# Patient Record
Sex: Female | Born: 1980 | Race: Black or African American | Hispanic: No | Marital: Single
Health system: Southern US, Community
[De-identification: ages and names within clinical notes are randomized; demographics above are authoritative.]

## PROBLEM LIST (undated history)

## (undated) DIAGNOSIS — J309 Allergic rhinitis, unspecified: Secondary | ICD-10-CM

## (undated) DIAGNOSIS — Z8571 Personal history of Hodgkin lymphoma: Secondary | ICD-10-CM

## (undated) DIAGNOSIS — K589 Irritable bowel syndrome without diarrhea: Secondary | ICD-10-CM

## (undated) DIAGNOSIS — G479 Sleep disorder, unspecified: Secondary | ICD-10-CM

## (undated) DIAGNOSIS — G43909 Migraine, unspecified, not intractable, without status migrainosus: Secondary | ICD-10-CM

## (undated) DIAGNOSIS — F32A Depression, unspecified: Secondary | ICD-10-CM

## (undated) DIAGNOSIS — F329 Major depressive disorder, single episode, unspecified: Secondary | ICD-10-CM

## (undated) HISTORY — DX: Personal history of Hodgkin lymphoma: Z85.71

## (undated) HISTORY — DX: Irritable bowel syndrome, unspecified: K58.9

## (undated) HISTORY — DX: Sleep disorder, unspecified: G47.9

## (undated) HISTORY — DX: Allergic rhinitis, unspecified: J30.9

## (undated) HISTORY — DX: Depression, unspecified: F32.A

## (undated) HISTORY — PX: CHOLECYSTECTOMY: SHX55

## (undated) HISTORY — DX: Major depressive disorder, single episode, unspecified: F32.9

## (undated) HISTORY — PX: LYMPH NODE BIOPSY: SHX201

## (undated) HISTORY — DX: Migraine, unspecified, not intractable, without status migrainosus: G43.909

---

## 2000-01-03 ENCOUNTER — Emergency Department (HOSPITAL_COMMUNITY): Admission: EM | Admit: 2000-01-03 | Discharge: 2000-01-04 | Payer: Self-pay | Admitting: Emergency Medicine

## 2002-02-11 ENCOUNTER — Other Ambulatory Visit: Admission: RE | Admit: 2002-02-11 | Discharge: 2002-02-11 | Payer: Self-pay | Admitting: Oncology

## 2002-02-11 ENCOUNTER — Encounter (INDEPENDENT_AMBULATORY_CARE_PROVIDER_SITE_OTHER): Payer: Self-pay | Admitting: Specialist

## 2002-08-31 DIAGNOSIS — Z8571 Personal history of Hodgkin lymphoma: Secondary | ICD-10-CM

## 2002-08-31 HISTORY — DX: Personal history of Hodgkin lymphoma: Z85.71

## 2003-08-07 ENCOUNTER — Other Ambulatory Visit: Admission: RE | Admit: 2003-08-07 | Discharge: 2003-08-07 | Payer: Self-pay | Admitting: Family Medicine

## 2003-12-01 ENCOUNTER — Encounter: Admission: RE | Admit: 2003-12-01 | Discharge: 2003-12-01 | Payer: Self-pay | Admitting: Family Medicine

## 2004-01-21 ENCOUNTER — Emergency Department (HOSPITAL_COMMUNITY): Admission: EM | Admit: 2004-01-21 | Discharge: 2004-01-21 | Payer: Self-pay | Admitting: Emergency Medicine

## 2004-01-28 ENCOUNTER — Encounter (INDEPENDENT_AMBULATORY_CARE_PROVIDER_SITE_OTHER): Payer: Self-pay | Admitting: *Deleted

## 2004-01-28 ENCOUNTER — Ambulatory Visit (HOSPITAL_COMMUNITY): Admission: RE | Admit: 2004-01-28 | Discharge: 2004-01-29 | Payer: Self-pay | Admitting: Surgery

## 2004-03-23 ENCOUNTER — Ambulatory Visit: Payer: Self-pay | Admitting: Oncology

## 2004-10-22 ENCOUNTER — Other Ambulatory Visit: Admission: RE | Admit: 2004-10-22 | Discharge: 2004-10-22 | Payer: Self-pay | Admitting: *Deleted

## 2005-11-07 ENCOUNTER — Encounter: Admission: RE | Admit: 2005-11-07 | Discharge: 2005-11-07 | Payer: Self-pay | Admitting: Gastroenterology

## 2007-01-25 ENCOUNTER — Other Ambulatory Visit: Admission: RE | Admit: 2007-01-25 | Discharge: 2007-01-25 | Payer: Self-pay | Admitting: Family Medicine

## 2008-03-14 ENCOUNTER — Other Ambulatory Visit: Admission: RE | Admit: 2008-03-14 | Discharge: 2008-03-14 | Payer: Self-pay | Admitting: Family Medicine

## 2009-04-09 ENCOUNTER — Other Ambulatory Visit: Admission: RE | Admit: 2009-04-09 | Discharge: 2009-04-09 | Payer: Self-pay | Admitting: Family Medicine

## 2010-05-07 ENCOUNTER — Other Ambulatory Visit
Admission: RE | Admit: 2010-05-07 | Discharge: 2010-05-07 | Payer: Self-pay | Source: Home / Self Care | Admitting: Family Medicine

## 2010-09-17 NOTE — Op Note (Signed)
Jessica Suarez, Jessica Suarez              ACCOUNT NO.:  [id number]   MEDICAL RECORD NO.:  [id number]          PATIENT TYPE:  OIB   LOCATION:  2550                         FACILITY:  [facility]   PHYSICIAN:  Thornton Park. Daphine Deutscher, M.D.DATE OF BIRTH:  1981/04/23   DATE OF PROCEDURE:  01/28/2004  DATE OF DISCHARGE:                                 OPERATIVE REPORT   PREOPERATIVE DIAGNOSIS:  History of Hodgkin's disease with recurrent bouts  of upper abdominal pain and gallstones.   POSTOPERATIVE DIAGNOSIS:  History of Hodgkin's disease with recurrent bouts  of upper abdominal pain and gallstones.   OPERATION PERFORMED:  Laparoscopic cholecystectomy with intraoperative  cholangiograms.   FINDINGS:  Marked sclerotic adhesions to the gallbladder possible secondary  to radiation as well as cholecystitis.  Normal small ductal anatomy noted.   SURGEON:  Thornton Park. Daphine Deutscher, M.D.   ASSISTANT:  Jimmye Norman, M.D.   ANESTHESIA:  General.   INDICATIONS FOR PROCEDURE:  Jessica Suarez is a 30 year old with a history of  successfully treated Hodgkin's disease.   DESCRIPTION OF PROCEDURE:  She was taken to room 17 on January 28, 2004  and given general anesthesia.  The abdomen was prepped with Betadine and  draped sterilely.  A longitudinal incision was made down into her umbilicus  through which I gently dilated to the point where the Hasson cannula could  fit.  The abdomen was insufflated and then three trocars were placed in the  upper abdomen, a 10 in the midline and two laterally.  The gallbladder was  grasped and it had some liver tissue growing out on this but we grasped and  elevated it.  It was long, had a lot of whitish thick adhesions along the  body and fundus.  The fundus was impacted with stones.  This was grasped and  elevated and I carefully dissected free what eventually was Calot's  triangle.  There was also a lymph node in Calot's triangle which I plucked  and sent separately for pathologic  examination.  Clip was placed up on the  gallbladder and the cystic duct was incised and a Reddick catheter was  inserted and a dynamic cholangiogram performed which showed a very small  common bile duct with intrahepatic filling and then free flow into the  duodenum.  The cystic duct was triple clipped, divided.  The cystic artery  was double clipped and divided and then the gallbladder was removed from the  gallbladder bed without entering it. Hemostasis was achieved throughout.  Once detached it was put in a back and brought out through the umbilicus  where it was very tedious to crush the stones and get them out and  eventually got the gallbladder and the gallstones out with the bag intact.  I then went in and repaired the umbilical defect with a figure-of-eight  suture of 0 Vicryl.  I went back in and looked at the  gallbladder bed and no bleeding or bile leaks were noted.  The wounds were  infiltrated with 0.5% Marcaine plain.  The patient then had 4-0 Vicryl  placed in the subcutaneous tissue with  benzoin and Steri-Strips.  The  patient tolerated the procedure well and taken to recovery room in  satisfactory condition.       MBM/MEDQ  D:  01/28/2004  T:  01/28/2004  Job:  [id number]   cc:   Dr. Lester [place]

## 2014-07-24 ENCOUNTER — Other Ambulatory Visit (HOSPITAL_COMMUNITY)
Admission: RE | Admit: 2014-07-24 | Discharge: 2014-07-24 | Disposition: A | Discharge status: Home / Self Care | Payer: Managed Care, Other (non HMO) | Source: Ambulatory Visit | Attending: Family Medicine | Admitting: Family Medicine

## 2014-07-24 ENCOUNTER — Other Ambulatory Visit: Payer: Self-pay | Admitting: Family Medicine

## 2014-07-24 DIAGNOSIS — Z1151 Encounter for screening for human papillomavirus (HPV): Secondary | ICD-10-CM | POA: Diagnosis present

## 2014-07-24 DIAGNOSIS — Z124 Encounter for screening for malignant neoplasm of cervix: Secondary | ICD-10-CM | POA: Insufficient documentation

## 2014-07-24 DIAGNOSIS — Z113 Encounter for screening for infections with a predominantly sexual mode of transmission: Secondary | ICD-10-CM | POA: Insufficient documentation

## 2014-07-29 LAB — CYTOLOGY - PAP

## 2015-01-10 DIAGNOSIS — R05 Cough: Secondary | ICD-10-CM | POA: Insufficient documentation

## 2015-01-10 DIAGNOSIS — R059 Cough, unspecified: Secondary | ICD-10-CM

## 2015-01-10 DIAGNOSIS — J309 Allergic rhinitis, unspecified: Secondary | ICD-10-CM

## 2015-01-10 DIAGNOSIS — H1010 Acute atopic conjunctivitis, unspecified eye: Secondary | ICD-10-CM | POA: Insufficient documentation

## 2015-01-30 ENCOUNTER — Ambulatory Visit (INDEPENDENT_AMBULATORY_CARE_PROVIDER_SITE_OTHER): Payer: Managed Care, Other (non HMO) | Admitting: Neurology

## 2015-01-30 DIAGNOSIS — J309 Allergic rhinitis, unspecified: Secondary | ICD-10-CM

## 2015-02-06 ENCOUNTER — Ambulatory Visit (INDEPENDENT_AMBULATORY_CARE_PROVIDER_SITE_OTHER): Payer: Managed Care, Other (non HMO) | Admitting: Neurology

## 2015-02-06 DIAGNOSIS — J309 Allergic rhinitis, unspecified: Secondary | ICD-10-CM | POA: Diagnosis not present

## 2015-02-20 ENCOUNTER — Ambulatory Visit (INDEPENDENT_AMBULATORY_CARE_PROVIDER_SITE_OTHER): Payer: Managed Care, Other (non HMO) | Admitting: Neurology

## 2015-02-20 DIAGNOSIS — J309 Allergic rhinitis, unspecified: Secondary | ICD-10-CM | POA: Diagnosis not present

## 2015-03-19 ENCOUNTER — Ambulatory Visit (INDEPENDENT_AMBULATORY_CARE_PROVIDER_SITE_OTHER): Payer: Managed Care, Other (non HMO)

## 2015-03-19 DIAGNOSIS — J309 Allergic rhinitis, unspecified: Secondary | ICD-10-CM | POA: Diagnosis not present

## 2015-03-25 ENCOUNTER — Ambulatory Visit (INDEPENDENT_AMBULATORY_CARE_PROVIDER_SITE_OTHER): Payer: Managed Care, Other (non HMO)

## 2015-03-25 DIAGNOSIS — J309 Allergic rhinitis, unspecified: Secondary | ICD-10-CM | POA: Diagnosis not present

## 2015-03-27 ENCOUNTER — Other Ambulatory Visit: Payer: Self-pay | Admitting: Allergy and Immunology

## 2015-03-30 NOTE — Telephone Encounter (Signed)
Patient needs office visit.  

## 2015-04-10 ENCOUNTER — Ambulatory Visit (INDEPENDENT_AMBULATORY_CARE_PROVIDER_SITE_OTHER): Payer: Managed Care, Other (non HMO) | Admitting: Neurology

## 2015-04-10 DIAGNOSIS — J309 Allergic rhinitis, unspecified: Secondary | ICD-10-CM

## 2015-04-20 ENCOUNTER — Other Ambulatory Visit: Payer: Self-pay | Admitting: Neurology

## 2015-04-20 MED ORDER — MONTELUKAST SODIUM 10 MG PO TABS: 10.0000 mg | ORAL_TABLET | Freq: Every day | ORAL | Status: DC

## 2015-04-23 ENCOUNTER — Ambulatory Visit (INDEPENDENT_AMBULATORY_CARE_PROVIDER_SITE_OTHER): Payer: Managed Care, Other (non HMO)

## 2015-04-23 DIAGNOSIS — J309 Allergic rhinitis, unspecified: Secondary | ICD-10-CM | POA: Diagnosis not present

## 2015-05-01 ENCOUNTER — Ambulatory Visit (INDEPENDENT_AMBULATORY_CARE_PROVIDER_SITE_OTHER): Payer: Managed Care, Other (non HMO) | Admitting: Neurology

## 2015-05-01 DIAGNOSIS — J309 Allergic rhinitis, unspecified: Secondary | ICD-10-CM | POA: Diagnosis not present

## 2015-05-05 ENCOUNTER — Other Ambulatory Visit: Payer: Self-pay | Admitting: Allergy and Immunology

## 2015-05-05 NOTE — Telephone Encounter (Signed)
Patient needs office visit for additional refills 

## 2015-05-22 ENCOUNTER — Telehealth: Payer: Self-pay | Admitting: Neurology

## 2015-05-22 ENCOUNTER — Ambulatory Visit (INDEPENDENT_AMBULATORY_CARE_PROVIDER_SITE_OTHER): Payer: Managed Care, Other (non HMO) | Admitting: Neurology

## 2015-05-22 DIAGNOSIS — J309 Allergic rhinitis, unspecified: Secondary | ICD-10-CM

## 2015-05-22 MED ORDER — MOMETASONE FUROATE 50 MCG/ACT NA SUSP: NASAL | Status: DC

## 2015-05-22 NOTE — Telephone Encounter (Signed)
Medication sent in and patient notified. 

## 2015-05-22 NOTE — Addendum Note (Signed)
Addended by: Nestor Lewandowsky E on: 05/22/2015 04:08 PM   Modules accepted: Orders

## 2015-05-22 NOTE — Telephone Encounter (Signed)
Patient came in for allergy injection today and brought in a letter from her insurance saying that they would no longer cover her Omnaris nasal spray. They gave the alternatives of Flunisolide or mometasone nasal spray. Patient is due for an OV since last seen on 09-18-14 and made apt with Dr Willa Rough. Patient is asking if we could send her another spray covered by her insurance to the pharmacy before her apt or if she has to wait.

## 2015-05-22 NOTE — Telephone Encounter (Signed)
May send in generic Nasonex 1-2 sprays once daily #1 no refills.

## 2015-05-29 ENCOUNTER — Ambulatory Visit (INDEPENDENT_AMBULATORY_CARE_PROVIDER_SITE_OTHER): Payer: Managed Care, Other (non HMO) | Admitting: Neurology

## 2015-05-29 DIAGNOSIS — J309 Allergic rhinitis, unspecified: Secondary | ICD-10-CM

## 2015-06-12 ENCOUNTER — Ambulatory Visit (INDEPENDENT_AMBULATORY_CARE_PROVIDER_SITE_OTHER): Payer: Managed Care, Other (non HMO) | Admitting: Neurology

## 2015-06-12 DIAGNOSIS — J309 Allergic rhinitis, unspecified: Secondary | ICD-10-CM

## 2015-06-19 ENCOUNTER — Ambulatory Visit (INDEPENDENT_AMBULATORY_CARE_PROVIDER_SITE_OTHER): Payer: Managed Care, Other (non HMO)

## 2015-06-19 DIAGNOSIS — J309 Allergic rhinitis, unspecified: Secondary | ICD-10-CM

## 2015-07-03 ENCOUNTER — Ambulatory Visit (INDEPENDENT_AMBULATORY_CARE_PROVIDER_SITE_OTHER): Payer: Managed Care, Other (non HMO) | Admitting: *Deleted

## 2015-07-03 ENCOUNTER — Ambulatory Visit (INDEPENDENT_AMBULATORY_CARE_PROVIDER_SITE_OTHER): Payer: Managed Care, Other (non HMO) | Admitting: Allergy and Immunology

## 2015-07-03 ENCOUNTER — Encounter: Payer: Self-pay | Admitting: Allergy and Immunology

## 2015-07-03 VITALS — BP 130/95 | HR 80 | Temp 98.5°F | Resp 16

## 2015-07-03 DIAGNOSIS — H1010 Acute atopic conjunctivitis, unspecified eye: Secondary | ICD-10-CM

## 2015-07-03 DIAGNOSIS — R05 Cough: Secondary | ICD-10-CM | POA: Diagnosis not present

## 2015-07-03 DIAGNOSIS — J309 Allergic rhinitis, unspecified: Secondary | ICD-10-CM

## 2015-07-03 DIAGNOSIS — H1045 Other chronic allergic conjunctivitis: Secondary | ICD-10-CM | POA: Diagnosis not present

## 2015-07-03 DIAGNOSIS — R059 Cough, unspecified: Secondary | ICD-10-CM

## 2015-07-03 MED ORDER — MONTELUKAST SODIUM 10 MG PO TABS: 10.0000 mg | ORAL_TABLET | Freq: Every day | ORAL | Status: DC

## 2015-07-03 NOTE — Progress Notes (Signed)
     FOLLOW UP NOTE  RE: Jessica Suarez MRN: [id number] DOB: 15-Dec-1980 [facility] [address] [place] [id number] Date of Office Visit: 07/03/2015  Subjective:  Jessica Suarez is a 35 y.o. female who presents today for Nasal Congestion  Assessment:   1. Cough, secondary to postnasal drip improved.    2. Seasonal allergic conjunctivitis   3.      Seasonal and perennial allergic rhinitis--on immunotherapy, tolerating well. 4.      History of migraines--with multiple medication regime. Plan:   Meds ordered this encounter  Medications  . montelukast (SINGULAIR) 10 MG tablet    Sig: Take 1 tablet (10 mg total) by mouth at bedtime.    Dispense:  30 tablet    Refill:  5   Patient Instructions  1.  Give trial of Rhinocort 1-2 sprays once each morning in place of Nasonex. 2.  Continue Singulair, Allegra and Patanase. 3.  Continue immunotherapy. 4.  EpiPen, Benadryl as needed. 5.  Follow-up in 6 months or sooner if needed. 6.  Consider trial of Dymista--call back with update as discussed.  HPI: Jessica Suarez returns to the office in follow-up of allergic rhinoconjunctivitis, intermittent cough.  Since her last visit in May, she describes overall doing well, feeling her breathing is very good.  Denies difficulty with sleep or activity.  She has noted in the last few weeks with fluctuant weather patterns, occasional nasal congestion, postnasal drip with throat clearing.  Denies discolored drainage, headache, fever or sore throat.  She has typically preferred Omnaris, but is not available through her insurance and Nasonex seems partially beneficial.  No other recurring difficulty.  She has maintained other medications consistently and participates in exercise without cough, wheeze, shortness of breath or any chest concerns.  Denies ED or urgent care visits, prednisone or antibiotic courses. Reports sleep and activity are normal.  Continues on immunotherapy  without any large local or systemic reactions and is pleased with this benefit (she reports her EpiPen is up-to-date).  No recent albuterol use.  Jessica Suarez has a current medication list which includes the following prescription(s): albuterol sulfate, apri, epinephrine, eszopiclone, eszopiclone, fexofenadine, ibuprofen, lamotrigine, mometasone, montelukast, olopatadine hcl, quetiapine, topiramate, vitamin d (ergocalciferol.   Drug Allergies: No Known Allergies  Objective:   Filed Vitals:   07/03/15 0919  BP: 130/95  Pulse: 80  Temp: 98.5 F (36.9 C)  Resp: 16   Physical Exam  Constitutional: She is well-developed, well-nourished, and in no distress.  HENT:  Head: Atraumatic.  Right Ear: Tympanic membrane and ear canal normal.  Left Ear: Tympanic membrane and ear canal normal.  Nose: Mucosal edema present. No rhinorrhea. No epistaxis.  Mouth/Throat: Oropharynx is clear and moist and mucous membranes are normal. No oropharyngeal exudate, posterior oropharyngeal edema or posterior oropharyngeal erythema.  Neck: Neck supple.  Cardiovascular: Normal rate, S1 normal and S2 normal.   No murmur heard. Pulmonary/Chest: Effort normal. She has no wheezes. She has no rhonchi. She has no rales.  Lymphadenopathy:    She has no cervical adenopathy.   Diagnostics: Spirometry:  FVC 2.04--76%,  FEV1 1.97--85 %.    Roselyn M. Willa RoughHicks, MD  cc: Neldon LabellaMILLER,LISA LYNN, MD

## 2015-07-03 NOTE — Addendum Note (Signed)
Addended by: Nida BoatmanSCEARCE, Sesar Madewell E on: 07/03/2015 02:04 PM   Modules accepted: Orders

## 2015-07-03 NOTE — Patient Instructions (Addendum)
   Give trial of Rhinocort 12 sprays once each morning and place her Nasonex.  Continue Singulair, Allegra and Patanase.  Continue immunotherapy.  EpiPen, Benadryl as needed.  Follow-up in 6 months or sooner if needed.  Consider trial of Dymista--call back with up-to-date as discussed.

## 2015-07-15 ENCOUNTER — Telehealth: Payer: Self-pay | Admitting: *Deleted

## 2015-07-15 MED ORDER — AZELASTINE-FLUTICASONE 137-50 MCG/ACT NA SUSP: 1.0000 | Freq: Two times a day (BID) | NASAL | Status: DC

## 2015-07-15 NOTE — Telephone Encounter (Signed)
Send Dymista one spray twice daily.  Disp: one; 3 refills.

## 2015-07-15 NOTE — Telephone Encounter (Signed)
Patient called states was seen by Dr.Hicks 07/03/15 and was given nasal spray samples to try states she likes Dymista would like a prescription for this sent to pharmacy. Please advise. Also Clinical research associatewriter informed that if a PA is required it might take longer for her to receive will leave a sample upfront so patient can have while PA is being done.

## 2015-07-15 NOTE — Telephone Encounter (Signed)
Sent Dymista in left message advising patient that we sent in if questions she can call us back

## 2015-07-16 ENCOUNTER — Ambulatory Visit (INDEPENDENT_AMBULATORY_CARE_PROVIDER_SITE_OTHER): Payer: Managed Care, Other (non HMO)

## 2015-07-16 DIAGNOSIS — J309 Allergic rhinitis, unspecified: Secondary | ICD-10-CM

## 2015-07-22 DIAGNOSIS — J3089 Other allergic rhinitis: Secondary | ICD-10-CM | POA: Diagnosis not present

## 2015-07-23 DIAGNOSIS — J301 Allergic rhinitis due to pollen: Secondary | ICD-10-CM | POA: Diagnosis not present

## 2015-07-24 ENCOUNTER — Ambulatory Visit (INDEPENDENT_AMBULATORY_CARE_PROVIDER_SITE_OTHER): Payer: Managed Care, Other (non HMO)

## 2015-07-24 DIAGNOSIS — J309 Allergic rhinitis, unspecified: Secondary | ICD-10-CM | POA: Diagnosis not present

## 2015-08-05 ENCOUNTER — Other Ambulatory Visit: Payer: Self-pay | Admitting: Family Medicine

## 2015-08-05 DIAGNOSIS — R9389 Abnormal findings on diagnostic imaging of other specified body structures: Secondary | ICD-10-CM

## 2015-08-07 ENCOUNTER — Ambulatory Visit
Admission: RE | Admit: 2015-08-07 | Discharge: 2015-08-07 | Disposition: A | Discharge status: Home / Self Care | Payer: Managed Care, Other (non HMO) | Source: Ambulatory Visit | Attending: Family Medicine | Admitting: Family Medicine

## 2015-08-07 ENCOUNTER — Ambulatory Visit (INDEPENDENT_AMBULATORY_CARE_PROVIDER_SITE_OTHER): Payer: Managed Care, Other (non HMO) | Admitting: *Deleted

## 2015-08-07 DIAGNOSIS — J309 Allergic rhinitis, unspecified: Secondary | ICD-10-CM

## 2015-08-07 DIAGNOSIS — R9389 Abnormal findings on diagnostic imaging of other specified body structures: Secondary | ICD-10-CM

## 2015-08-07 MED ORDER — IOPAMIDOL (ISOVUE-300) INJECTION 61%
75.0000 mL | Freq: Once | INTRAVENOUS | Status: AC | PRN
  Administered 2015-08-07: 75 mL via INTRAVENOUS

## 2015-08-13 ENCOUNTER — Other Ambulatory Visit: Payer: Self-pay | Admitting: Family Medicine

## 2015-08-13 DIAGNOSIS — N63 Unspecified lump in unspecified breast: Secondary | ICD-10-CM

## 2015-08-17 ENCOUNTER — Other Ambulatory Visit: Payer: Self-pay | Admitting: Family Medicine

## 2015-08-17 DIAGNOSIS — N63 Unspecified lump in unspecified breast: Secondary | ICD-10-CM

## 2015-08-20 ENCOUNTER — Ambulatory Visit (INDEPENDENT_AMBULATORY_CARE_PROVIDER_SITE_OTHER): Payer: Managed Care, Other (non HMO)

## 2015-08-20 DIAGNOSIS — J309 Allergic rhinitis, unspecified: Secondary | ICD-10-CM

## 2015-08-21 ENCOUNTER — Ambulatory Visit
Admission: RE | Admit: 2015-08-21 | Discharge: 2015-08-21 | Disposition: A | Discharge status: Home / Self Care | Payer: Managed Care, Other (non HMO) | Source: Ambulatory Visit | Attending: Family Medicine | Admitting: Family Medicine

## 2015-08-21 DIAGNOSIS — N63 Unspecified lump in unspecified breast: Secondary | ICD-10-CM

## 2015-09-03 ENCOUNTER — Ambulatory Visit (INDEPENDENT_AMBULATORY_CARE_PROVIDER_SITE_OTHER): Payer: Managed Care, Other (non HMO)

## 2015-09-03 DIAGNOSIS — J309 Allergic rhinitis, unspecified: Secondary | ICD-10-CM

## 2015-09-11 ENCOUNTER — Ambulatory Visit (INDEPENDENT_AMBULATORY_CARE_PROVIDER_SITE_OTHER): Payer: Managed Care, Other (non HMO) | Admitting: *Deleted

## 2015-09-11 DIAGNOSIS — J309 Allergic rhinitis, unspecified: Secondary | ICD-10-CM | POA: Diagnosis not present

## 2015-09-11 MED ORDER — MONTELUKAST SODIUM 10 MG PO TABS: 10.0000 mg | ORAL_TABLET | Freq: Every day | ORAL | Status: DC

## 2015-09-18 ENCOUNTER — Ambulatory Visit (INDEPENDENT_AMBULATORY_CARE_PROVIDER_SITE_OTHER): Payer: Managed Care, Other (non HMO)

## 2015-09-18 DIAGNOSIS — J309 Allergic rhinitis, unspecified: Secondary | ICD-10-CM

## 2015-10-09 ENCOUNTER — Ambulatory Visit (INDEPENDENT_AMBULATORY_CARE_PROVIDER_SITE_OTHER): Payer: Managed Care, Other (non HMO) | Admitting: *Deleted

## 2015-10-09 DIAGNOSIS — J309 Allergic rhinitis, unspecified: Secondary | ICD-10-CM

## 2015-10-30 ENCOUNTER — Ambulatory Visit (INDEPENDENT_AMBULATORY_CARE_PROVIDER_SITE_OTHER): Payer: Managed Care, Other (non HMO) | Admitting: *Deleted

## 2015-10-30 DIAGNOSIS — J309 Allergic rhinitis, unspecified: Secondary | ICD-10-CM | POA: Diagnosis not present

## 2015-11-06 ENCOUNTER — Ambulatory Visit (INDEPENDENT_AMBULATORY_CARE_PROVIDER_SITE_OTHER): Payer: Managed Care, Other (non HMO) | Admitting: *Deleted

## 2015-11-06 DIAGNOSIS — J309 Allergic rhinitis, unspecified: Secondary | ICD-10-CM | POA: Diagnosis not present

## 2015-12-04 ENCOUNTER — Ambulatory Visit (INDEPENDENT_AMBULATORY_CARE_PROVIDER_SITE_OTHER): Payer: Managed Care, Other (non HMO)

## 2015-12-04 DIAGNOSIS — J309 Allergic rhinitis, unspecified: Secondary | ICD-10-CM

## 2015-12-18 ENCOUNTER — Telehealth: Payer: Self-pay

## 2015-12-18 ENCOUNTER — Ambulatory Visit (INDEPENDENT_AMBULATORY_CARE_PROVIDER_SITE_OTHER): Payer: Managed Care, Other (non HMO) | Admitting: *Deleted

## 2015-12-18 DIAGNOSIS — J309 Allergic rhinitis, unspecified: Secondary | ICD-10-CM

## 2015-12-18 NOTE — Telephone Encounter (Signed)
Pt is a Dr Willa RoughHicks pt and insurance will not cover dymista. Would you like to split it up and do azelastine and fluticasone?  Please advise.

## 2015-12-21 ENCOUNTER — Other Ambulatory Visit: Payer: Self-pay

## 2015-12-21 MED ORDER — FLUTICASONE PROPIONATE 50 MCG/ACT NA SUSP: 1.0000 | Freq: Two times a day (BID) | NASAL | 5 refills | Status: DC

## 2015-12-21 MED ORDER — AZELASTINE HCL 0.1 % NA SOLN: 1.0000 | Freq: Two times a day (BID) | NASAL | 5 refills | Status: DC

## 2015-12-21 NOTE — Telephone Encounter (Signed)
Yes that is fine to split.

## 2015-12-21 NOTE — Telephone Encounter (Signed)
Sent in azelastine and fluticasone 

## 2015-12-25 ENCOUNTER — Ambulatory Visit (INDEPENDENT_AMBULATORY_CARE_PROVIDER_SITE_OTHER): Payer: Managed Care, Other (non HMO)

## 2015-12-25 DIAGNOSIS — J309 Allergic rhinitis, unspecified: Secondary | ICD-10-CM | POA: Diagnosis not present

## 2016-01-08 ENCOUNTER — Ambulatory Visit (INDEPENDENT_AMBULATORY_CARE_PROVIDER_SITE_OTHER): Payer: Managed Care, Other (non HMO)

## 2016-01-08 DIAGNOSIS — J309 Allergic rhinitis, unspecified: Secondary | ICD-10-CM | POA: Diagnosis not present

## 2016-01-13 DIAGNOSIS — J3089 Other allergic rhinitis: Secondary | ICD-10-CM | POA: Diagnosis not present

## 2016-01-14 DIAGNOSIS — J301 Allergic rhinitis due to pollen: Secondary | ICD-10-CM | POA: Diagnosis not present

## 2016-01-27 ENCOUNTER — Other Ambulatory Visit: Payer: Self-pay

## 2016-01-28 ENCOUNTER — Other Ambulatory Visit: Payer: Self-pay

## 2016-01-29 ENCOUNTER — Ambulatory Visit (INDEPENDENT_AMBULATORY_CARE_PROVIDER_SITE_OTHER): Payer: Managed Care, Other (non HMO)

## 2016-01-29 DIAGNOSIS — J309 Allergic rhinitis, unspecified: Secondary | ICD-10-CM | POA: Diagnosis not present

## 2016-03-23 ENCOUNTER — Ambulatory Visit (INDEPENDENT_AMBULATORY_CARE_PROVIDER_SITE_OTHER): Payer: Managed Care, Other (non HMO) | Admitting: *Deleted

## 2016-03-23 DIAGNOSIS — H1010 Acute atopic conjunctivitis, unspecified eye: Secondary | ICD-10-CM

## 2016-03-23 DIAGNOSIS — H1045 Other chronic allergic conjunctivitis: Secondary | ICD-10-CM

## 2016-04-01 ENCOUNTER — Ambulatory Visit (INDEPENDENT_AMBULATORY_CARE_PROVIDER_SITE_OTHER): Payer: Managed Care, Other (non HMO)

## 2016-04-01 DIAGNOSIS — J309 Allergic rhinitis, unspecified: Secondary | ICD-10-CM | POA: Diagnosis not present

## 2016-04-01 DIAGNOSIS — H1010 Acute atopic conjunctivitis, unspecified eye: Secondary | ICD-10-CM

## 2016-04-12 ENCOUNTER — Ambulatory Visit (INDEPENDENT_AMBULATORY_CARE_PROVIDER_SITE_OTHER): Payer: Managed Care, Other (non HMO) | Admitting: *Deleted

## 2016-04-12 DIAGNOSIS — H1045 Other chronic allergic conjunctivitis: Secondary | ICD-10-CM | POA: Diagnosis not present

## 2016-04-12 DIAGNOSIS — H1010 Acute atopic conjunctivitis, unspecified eye: Secondary | ICD-10-CM

## 2016-04-21 ENCOUNTER — Ambulatory Visit (INDEPENDENT_AMBULATORY_CARE_PROVIDER_SITE_OTHER): Payer: Managed Care, Other (non HMO) | Admitting: *Deleted

## 2016-04-21 DIAGNOSIS — H1010 Acute atopic conjunctivitis, unspecified eye: Secondary | ICD-10-CM

## 2016-04-21 DIAGNOSIS — H1045 Other chronic allergic conjunctivitis: Secondary | ICD-10-CM

## 2016-06-02 ENCOUNTER — Ambulatory Visit (INDEPENDENT_AMBULATORY_CARE_PROVIDER_SITE_OTHER): Payer: Managed Care, Other (non HMO) | Admitting: Allergy

## 2016-06-02 ENCOUNTER — Ambulatory Visit (INDEPENDENT_AMBULATORY_CARE_PROVIDER_SITE_OTHER): Payer: Managed Care, Other (non HMO) | Admitting: *Deleted

## 2016-06-02 ENCOUNTER — Encounter: Payer: Self-pay | Admitting: Allergy

## 2016-06-02 VITALS — BP 110/75 | HR 80 | Temp 98.7°F | Resp 16

## 2016-06-02 DIAGNOSIS — J309 Allergic rhinitis, unspecified: Secondary | ICD-10-CM | POA: Diagnosis not present

## 2016-06-02 DIAGNOSIS — H1013 Acute atopic conjunctivitis, bilateral: Secondary | ICD-10-CM

## 2016-06-02 DIAGNOSIS — H1045 Other chronic allergic conjunctivitis: Secondary | ICD-10-CM | POA: Diagnosis not present

## 2016-06-02 DIAGNOSIS — H1010 Acute atopic conjunctivitis, unspecified eye: Secondary | ICD-10-CM

## 2016-06-02 NOTE — Progress Notes (Signed)
Follow-up Note  RE: Jessica Suarez MRN: [id number] DOB: 01/09/1981 Date of Office Visit: 06/02/2016   History of present illness: Jessica Suarez is a 36 y.o. female presenting today for follow-up of allergic rhinoconjunctivitis. She was last seen in the office in March 2017 by Dr. Willa Rough.  She has done relatively well since his last visit without any major health changes, surgeries or hospitalizations.  She has had some issues with insurance covering her previous medications. She was undiagnosed however no longer use due to insurance coverage and was changed to Flonase and Astelin separately.  During the 2 separate nasal spray she reports she does not feel she gets much improvement and continues to have nasal congestion and drainage.  She does report that he missed work well for her nasal symptoms when she was able to use.  She also has been out of Singulair for the past 3-4 months she does continue on Xyzal daily. She is on AIT and receives every 2 weeks injections. She denies any large local or systemic reactions. She is at maintenance dosing. She did have a history of cough secondary to postnasal drip however she states this is improved.   Review of systems: Review of Systems  Constitutional: Negative for chills, fever and malaise/fatigue.  HENT: Positive for congestion. Negative for ear pain, nosebleeds, sinus pain and sore throat.   Eyes: Negative for discharge and redness.  Respiratory: Negative for cough, shortness of breath and wheezing.   Cardiovascular: Negative for chest pain.  Gastrointestinal: Negative for abdominal pain, heartburn, nausea and vomiting.  Skin: Negative for itching and rash.    All other systems negative unless noted above in HPI  Past medical/social/surgical/family history have been reviewed and are unchanged unless specifically indicated below.  No changes  Medication List: Allergies as of 06/02/2016   No Known Allergies     Medication List      Accurate as of 06/02/16  1:38 PM. Always use your most recent med list.          APRI 0.15-30 MG-MCG tablet Generic drug:  desogestrel-ethinyl estradiol TAKE 1 TABLET EVERY DAY CONTINUOUSLY FOR 3 MONTHS   azelastine 0.1 % nasal spray Commonly known as:  ASTELIN Place 1 spray into both nostrils 2 (two) times daily. Use in each nostril as directed   Azelastine-Fluticasone 137-50 MCG/ACT Susp Commonly known as:  DYMISTA Place 1 spray into both nostrils 2 (two) times daily.   ciclesonide 50 MCG/ACT nasal spray Commonly known as:  OMNARIS Place 2 sprays into both nostrils daily. Reported on 07/03/2015   EPIPEN 2-PAK 0.3 mg/0.3 mL Soaj injection Generic drug:  EPINEPHrine Inject into the muscle as needed (INJECTION PROTOCOL).   eszopiclone 2 MG Tabs tablet Commonly known as:  LUNESTA Take 2 mg by mouth at bedtime as needed for sleep. Take immediately before bedtime   Eszopiclone 3 MG Tabs TAKE 1 TABLET BY MOUTH AT BEDTIME AS NEEDED FOR INSOMNIA   fluticasone 50 MCG/ACT nasal spray Commonly known as:  FLONASE Place 1 spray into both nostrils 2 (two) times daily.   ibuprofen 600 MG tablet Commonly known as:  ADVIL,MOTRIN TAKE 1 TABLET EVERY 6 TO 8 HOURS AS NEEDED FOR PAIN   LAMICTAL 200 MG tablet Generic drug:  lamoTRIgine Take 200 mg by mouth daily.   methylphenidate 20 MG tablet Commonly known as:  RITALIN   mometasone 50 MCG/ACT nasal spray Commonly known as:  NASONEX Use 1-2 sprays once daily as needed for stuffy nose  or drainage   montelukast 10 MG tablet Commonly known as:  SINGULAIR Take 1 tablet (10 mg total) by mouth at bedtime.   oxybutynin 5 MG tablet Commonly known as:  DITROPAN   PROAIR RESPICLICK 108 (90 Base) MCG/ACT Aepb Generic drug:  Albuterol Sulfate Inhale into the lungs.   QUEtiapine 200 MG tablet Commonly known as:  SEROQUEL Take 200 mg by mouth at bedtime.   topiramate 25 MG tablet Commonly known as:  TOPAMAX TAKE 1 TABLET BY MOUTH AT  BEDTIME , THEN STOP IF ABLE TO SLEEP   Vitamin D (Ergocalciferol) 50000 units Caps capsule Commonly known as:  DRISDOL       Known medication allergies: No Known Allergies   Physical examination: Blood pressure 110/75, pulse 80, temperature 98.7 F (37.1 C), temperature source Oral, resp. rate 16, SpO2 99 %.  General: Alert, interactive, in no acute distress. HEENT: TMs pearly gray, turbinates moderately edematous with thick discharge, post-pharynx non erythematous. Neck: Supple without lymphadenopathy. Lungs: Clear to auscultation without wheezing, rhonchi or rales. {no increased work of breathing. CV: Normal S1, S2 without murmurs. Abdomen: Nondistended, nontender. Skin: Warm and dry, without lesions or rashes. Extremities:  No clubbing, cyanosis or edema. Neuro:   Grossly intact.  Diagnositics/Labs: None today  Assessment and plan: Allergic rhinoconjunctivitis   - Resume Singulair 10 mg daily taken at bedtime.   - Continue Xyzal 5 mg daily   - Use Dymista samples 1 spray twice a day.  Given that she has tried Flonase and Astelin separately and did not have improvement in symptoms will try to get Dymista covered through insurance.  She has had good relief of her nasal symptoms on dymista in the past.   - Saline rinse daily   - Continue immunotherapy -- she received injection today   - EpiPen, Benadryl as needed.  Follow-up in 6 months or sooner if needed.  I appreciate the opportunity to take part in Jenelle's care. Please do not hesitate to contact me with questions.  Sincerely,   Margo AyeShaylar Trapper Meech, MD Allergy/Immunology [facility]

## 2016-06-02 NOTE — Patient Instructions (Signed)
  Resume Singulair  Continue Xyzal  Use Dymista samples 1 spray twice a day  Saline rinse daily  Continue immunotherapy.  EpiPen, Benadryl as needed.  Follow-up in 6 months or sooner if needed.

## 2016-06-06 NOTE — Addendum Note (Signed)
Addended by: Berna BueWHITAKER, Antigone Crowell L on: 06/06/2016 11:58 AM   Modules accepted: Orders

## 2016-06-23 ENCOUNTER — Ambulatory Visit: Payer: Self-pay | Admitting: *Deleted

## 2016-07-15 ENCOUNTER — Ambulatory Visit (INDEPENDENT_AMBULATORY_CARE_PROVIDER_SITE_OTHER): Payer: Managed Care, Other (non HMO) | Admitting: *Deleted

## 2016-07-15 ENCOUNTER — Other Ambulatory Visit: Payer: Self-pay | Admitting: *Deleted

## 2016-07-15 DIAGNOSIS — J309 Allergic rhinitis, unspecified: Secondary | ICD-10-CM | POA: Diagnosis not present

## 2016-07-15 MED ORDER — AZELASTINE-FLUTICASONE 137-50 MCG/ACT NA SUSP: 1.0000 | Freq: Two times a day (BID) | NASAL | 3 refills | Status: DC

## 2017-02-14 ENCOUNTER — Encounter (HOSPITAL_COMMUNITY): Payer: Self-pay

## 2017-02-16 ENCOUNTER — Ambulatory Visit (HOSPITAL_COMMUNITY)
Admission: RE | Admit: 2017-02-16 | Discharge: 2017-02-16 | Disposition: A | Discharge status: Home / Self Care | Payer: 59 | Source: Ambulatory Visit | Attending: Obstetrics and Gynecology | Admitting: Obstetrics and Gynecology

## 2017-02-16 ENCOUNTER — Encounter (HOSPITAL_COMMUNITY): Payer: Self-pay

## 2017-02-16 DIAGNOSIS — Z8571 Personal history of Hodgkin lymphoma: Secondary | ICD-10-CM | POA: Diagnosis not present

## 2017-02-16 DIAGNOSIS — Z3169 Encounter for other general counseling and advice on procreation: Secondary | ICD-10-CM | POA: Insufficient documentation

## 2017-02-16 NOTE — ED Notes (Signed)
[facility] for pre[facility]ception consult with Dr. Ezzard StandingNewman.  Ht 595ft2in, Wt 162.2 lbs.  BP 127 95, HR109 RR 20.

## 2017-02-16 NOTE — Consult Note (Signed)
Maternal Fetal Medicine Consultation  Requesting Provider(s): Stringer  Primary OB: Stefano Gaul Reason for consultation: Preconceptual consult for medications and AMA  HPI: 36 yo P0 currently attempting pregnancy through Dr. Debria Garret office with extensive history of ADHD, anxious depression, and Previous treatment for Hodgkin's lymphoma. She sees Dr. Corie Chiquito for ADHD and psychiatric care who has discontinued her lamotrigine and topiramate, but remains on c. 30mg /d of methylphenidate for ADHD. She takes quetiapine 150-300mg  at bedtime for anxiety, as well as buspirone 30mg /d. She is on multiple medications for allergic rhinitis, none of which play a significant role in teratogenicity or perinatal problems. Assuming she gets pregnant soon, she would have c. 1 in 80 to 1 in 8 risk for aneuploidy based on age at delivery 106-40  OB History: OB History    Gravida Para Term Preterm AB Living   0 0 0 0 0 0   SAB TAB Ectopic Multiple Live Births   0 0 0 0 0      PMH:  Past Medical History:  Diagnosis Date  . Depression   . History of Hodgkin's lymphoma MAY 2004  . IBS (irritable bowel syndrome)   . Migraines   . Sleep disorder     PSH:  Past Surgical History:  Procedure Laterality Date  . CHOLECYSTECTOMY    . LYMPH NODE BIOPSY     Meds: See above Allergies: NKDA FH: See EPIC section Soc: See EPIC section  Review of Systems: Noncontributory  PE:  See EPIC section  A/P: 1. Potential for AMA: I discussed at length the risk for aneuploidy based on age at delivery and the testing modalities available once the patient became pregnant, including amniocentesis, cell-free DNA testing and maternal serum screens. She will consider these. I would recommend early genetic counseling once pregnancy is established 2. Medication exposure: I reassured her that the majority of medications she is taking are safe in pregnancy. I am glad Dr. Montez Morita has discontinued her atypical antiepileptics,  as the risk for anomalies is increased in these, particularly when not used as montherapy. The buspirone and quetiapine are safe to continue as well.  I have asked Ms. Flock to contact Dr. Montez Morita to explore alternatives to methylphenidate or reductions to minimal effective doses for pregnancy. A first trimester holiday is also acceptable if the patient can tolerate it. The methylphenidate is problematic for 2 reasons, first due to amphetamine analog exposure during the pregnancy and it's theoretical risk for abruption and neurodevelopmental effects, and secondly due to fairly new data concerning a slightly increased risk of cardiac defects. I have taken the opportunity to review the recent publication in JAMA Psychiatry about this association. It required the combination evaluation of [place] and [place] data to achieve statistical significance, which it just barely did (RR 1.28 with 95%CI 1.00-1.64) neither the 1.8 million patients in the [place] data set nor the 2.6 million patients in the [place] data sets achieved statistical significance on their own. The association is weak, and I did not discuss it with the patient. A repeat discussion can be held preconceptually if She and Dr. Montez Morita decide to keep the patient on methylphenidate A fetal echo can be performed if that occurs 3. Possible adriamycin exposure: the patient MAY have been exposed to adriamycin during her treatment for Stage IV Hodgkin's at age 15. It is a mainstay of chemotherapy now, but I am uncertain if was 15 years ago. The records are unavailable to me today. If exposed a echocardiogram would be prudent to  rule out subclinical cardiotoxicity  Thank you for the opportunity to be a part of the care of Jessica Suarez. Please contact our office if we can be of further assistance.   I spent approximately 30 minutes with this patient with over 50% of time spent in face-to-face counseling.

## 2017-02-20 ENCOUNTER — Other Ambulatory Visit (HOSPITAL_COMMUNITY): Payer: Self-pay | Admitting: Obstetrics and Gynecology

## 2017-02-20 DIAGNOSIS — N979 Female infertility, unspecified: Secondary | ICD-10-CM

## 2017-02-24 ENCOUNTER — Ambulatory Visit (HOSPITAL_COMMUNITY)
Admission: RE | Admit: 2017-02-24 | Discharge: 2017-02-24 | Disposition: A | Discharge status: Home / Self Care | Payer: 59 | Source: Ambulatory Visit | Attending: Obstetrics and Gynecology | Admitting: Obstetrics and Gynecology

## 2017-02-24 DIAGNOSIS — N979 Female infertility, unspecified: Secondary | ICD-10-CM | POA: Insufficient documentation

## 2017-02-24 MED ORDER — IOPAMIDOL (ISOVUE-300) INJECTION 61%
30.0000 mL | Freq: Once | INTRAVENOUS | Status: AC | PRN
  Administered 2017-02-24: 5 mL via INTRAVENOUS

## 2017-02-28 ENCOUNTER — Ambulatory Visit (HOSPITAL_COMMUNITY): Payer: Managed Care, Other (non HMO)

## 2018-02-18 ENCOUNTER — Encounter: Payer: Self-pay | Admitting: Emergency Medicine

## 2018-02-28 ENCOUNTER — Ambulatory Visit: Payer: Self-pay | Admitting: Psychiatry

## 2018-03-02 ENCOUNTER — Ambulatory Visit (INDEPENDENT_AMBULATORY_CARE_PROVIDER_SITE_OTHER): Payer: 59 | Admitting: Psychiatry

## 2018-03-02 ENCOUNTER — Encounter: Payer: Self-pay | Admitting: Psychiatry

## 2018-03-02 VITALS — BP 129/88 | HR 87

## 2018-03-02 DIAGNOSIS — F3281 Premenstrual dysphoric disorder: Secondary | ICD-10-CM

## 2018-03-02 DIAGNOSIS — F4010 Social phobia, unspecified: Secondary | ICD-10-CM | POA: Diagnosis not present

## 2018-03-02 DIAGNOSIS — F3181 Bipolar II disorder: Secondary | ICD-10-CM

## 2018-03-02 DIAGNOSIS — F988 Other specified behavioral and emotional disorders with onset usually occurring in childhood and adolescence: Secondary | ICD-10-CM

## 2018-03-02 MED ORDER — METHYLPHENIDATE HCL 20 MG PO TABS: ORAL_TABLET | ORAL | 0 refills | Status: DC

## 2018-03-02 MED ORDER — QUETIAPINE FUMARATE 100 MG PO TABS: ORAL_TABLET | ORAL | 1 refills | Status: DC

## 2018-03-02 MED ORDER — FLUOXETINE HCL 20 MG PO CAPS
ORAL_CAPSULE | ORAL | 0 refills | Status: DC
Start: 2018-03-02 — End: 2018-05-09

## 2018-03-02 MED ORDER — LAMOTRIGINE 150 MG PO TABS: 150.0000 mg | ORAL_TABLET | Freq: Every day | ORAL | 0 refills | Status: DC

## 2018-03-02 NOTE — Progress Notes (Signed)
Jessica Suarez [id number] 04-28-1981 37 y.o.  Subjective:   Patient ID:  Jessica Suarez is a 37 y.o. (DOB 03-13-1981) female.  Chief Complaint:  Chief Complaint  Patient presents with  . Anxiety  . ADD  . Depression    HPI Jessica Suarez presents to the office today for follow-up of mood s/s, anxiety, ADD, and insomnia. She reports multiple stressors at home and at work. Reports that she feels that her supervisor is undermining her. Reports that she and her husband have been having communication issues. She reports that she has long-standing social anxiety and that she is anxious in social situations, especially around people that she does not know well. She reports that she will also experience social anxiety around large groups of her family and good friends. She reports that husband would like for them to socialize more with his friends and she is uncomfortable with this. She reports that husband is extraverted and does not understand her discomfort around social situations. She reports that she has had some increased anxiety in response to stressors. She denies any recent panic attacks.   She reports that her mood has been more depressed recently. She reports low energy. "I'm exhausted all the time now." Reports low motivation at home and at work. Reports that she is behind on housework. Reports decreased sleep at times due to long work days and reports that she is "not getting what I need" in terms of sleep. Reports that she continues to feel fatigued even after sleeping longer intervals on days off. She reports that she has recently been binge eating. Reports that she will not eat much while at work and then will eat excessively at home. She reports that she can concentrate and be more productive when alone. Denies SI.   Reports that she frequently laughs at work and that sometimes she is laughing and does not know why, sometimes because she is anxious, and other times when she is having  thoughts or reactions that would not be appropriate to verbalize. She reports there have been multiple complaints about her laughter at work, and thinks this may be because people feel that she is minimizing their concerns. Has noticed that laughter has gradually gotten worse over the last year. "First of all I like to laugh." Reports that sometimes she will laugh in response to thinking about something amusing. She reports that "sometimes I laugh because I really want to cry." She reports that laughter also relieves some of her tension and feeling overwhelmed. Reports other times she will laugh as a way to create a "pause" to think about what she says instead of immediately saying it. Denies uncontrolled laughter or crying.   Past Medication Trials: Latuda- "did not like how it made me feel" Seroquel Wellbutrin-ineffective Zoloft Effexor Prozac-helpful for PMDD signs and symptoms Lexapro Provigil-not as helpful for daytime somnolence Methylphenidate BuSpar-helpful for anxiety Lamictal-helpful for mood  Review of Systems:  Review of Systems  Cardiovascular: Negative for palpitations.  Musculoskeletal: Negative for gait problem.  Neurological: Negative for tremors.  Psychiatric/Behavioral:       Please refer to HPI    Medications: I have reviewed the patient's current medications.  Current Outpatient Medications  Medication Sig Dispense Refill  . busPIRone (BUSPAR) 15 MG tablet Take 15 mg by mouth 2 (two) times daily.    Marland Kitchen ibuprofen (ADVIL,MOTRIN) 600 MG tablet TAKE 1 TABLET EVERY 6 TO 8 HOURS AS NEEDED FOR PAIN  0  . lamoTRIgine (LAMICTAL) 150  MG tablet Take 1 tablet (150 mg total) by mouth daily. 90 tablet 0  . levocetirizine (XYZAL) 5 MG tablet Take 5 mg by mouth every evening.    [name] 03/27/2018] methylphenidate (RITALIN) 20 MG tablet Take 1/2-1 po BID 60 tablet 0  . oxybutynin (DITROPAN) 5 MG tablet     . APRI 0.15-30 MG-MCG tablet TAKE 1 TABLET EVERY DAY CONTINUOUSLY FOR  3 MONTHS  1  . EPINEPHrine (EPIPEN 2-PAK) 0.3 mg/0.3 mL IJ SOAJ injection Inject into the muscle as needed (INJECTION PROTOCOL).    Marland Kitchen FLUoxetine (PROZAC) 20 MG capsule Take 1 capsule po daily 1-2 weeks prior to menses 90 capsule 0  . [START ON 04/24/2018] methylphenidate (RITALIN) 20 MG tablet Take 1/2-1 po BID 60 tablet 0  . methylphenidate (RITALIN) 20 MG tablet Take 1/2-1 po BID 60 tablet 0  . QUEtiapine (SEROQUEL) 100 MG tablet Take 2-3 tabs po QHS 270 tablet 1   No current facility-administered medications for this visit.     Medication Side Effects: None  Allergies: No Known Allergies  Past Medical History:  Diagnosis Date  . Allergic rhinitis   . Depression   . History of Hodgkin's lymphoma MAY 2004  . IBS (irritable bowel syndrome)   . Migraines   . Sleep disorder     Family History  Problem Relation Age of Onset  . Hypertension Mother   . Hypercholesterolemia Mother   . Asthma Father   . Diabetes Father   . Asthma Brother   . Bipolar disorder Brother   . Suicidality Brother   . Drug abuse Paternal Aunt   . Aneurysm Paternal Aunt   . Anxiety disorder Other   . Cancer Paternal Aunt     Social History   Socioeconomic History  . Marital status: Single    Spouse name: Not on file  . Number of children: Not on file  . Years of education: Not on file  . Highest education level: Not on file  Occupational History  . Not on file  Social Needs  . Financial resource strain: Not on file  . Food insecurity:    Worry: Not on file    Inability: Not on file  . Transportation needs:    Medical: Not on file    Non-medical: Not on file  Tobacco Use  . Smoking status: Passive Smoke Exposure - Never Smoker  . Smokeless tobacco: Never Used  Substance and Sexual Activity  . Alcohol use: Yes    Alcohol/week: 2.0 standard drinks    Types: 2 Shots of liquor per week  . Drug use: No  . Sexual activity: Yes    Birth control/protection: None  Lifestyle  . Physical  activity:    Days per week: Not on file    Minutes per session: Not on file  . Stress: Not on file  Relationships  . Social connections:    Talks on phone: Not on file    Gets together: Not on file    Attends religious service: Not on file    Active member of club or organization: Not on file    Attends meetings of clubs or organizations: Not on file    Relationship status: Not on file  . Intimate partner violence:    Fear of current or ex partner: Not on file    Emotionally abused: Not on file    Physically abused: Not on file    Forced sexual activity: Not on file  Other Topics Concern  .  Not on file  Social History Narrative  . Not on file    Past Medical History, Surgical history, Social history, and Family history were reviewed and updated as appropriate.   Please see review of systems for further details on the patient's review from today.   Objective:   Physical Exam:  BP 129/88   Pulse 87   Physical Exam  Constitutional: She is oriented to person, place, and time. She appears well-developed. No distress.  Musculoskeletal: She exhibits no deformity.  Neurological: She is alert and oriented to person, place, and time. Coordination normal.  Psychiatric: Her speech is normal and behavior is normal. Judgment and thought content normal. Her mood appears anxious. Her affect is not angry, not blunt, not labile and not inappropriate. Cognition and memory are normal. She exhibits a depressed mood. She expresses no homicidal and no suicidal ideation. She expresses no suicidal plans and no homicidal plans.  Insight intact. No auditory or visual hallucinations. No delusions.     Lab Review:  No results found for: NA, K, CL, CO2, GLUCOSE, BUN, CREATININE, CALCIUM, PROT, ALBUMIN, AST, ALT, ALKPHOS, BILITOT, GFRNONAA, GFRAA  No results found for: WBC, RBC, HGB, HCT, PLT, MCV, MCH, MCHC, RDW, LYMPHSABS, MONOABS, EOSABS, BASOSABS  No results found for: POCLITH, LITHIUM   No  results found for: PHENYTOIN, PHENOBARB, VALPROATE, CBMZ   .res Assessment: Plan:   Patient seen for 45 minutes and greater than 50% of visit spent counseling patient to include allowing her to ventilate regarding recent stressors and providing validation.  Discussed potential benefits of therapy and made a referral for couples counseling.  Discussed signs and symptoms of social anxiety and that the symptoms she describes are consistent with social anxiety.  Suggested reviewing signs and symptoms of social anxiety with her husband to increase his understanding of her difficulties with certain social situations.  Discussed treatment option of increasing lamotrigine, however patient reports that she prefers to continue 150 mg dose at this time. Will continue BuSpar 50 mg twice daily for anxiety Will continue Lamictal 150 mg daily for mood. Will continue Seroquel 100 mg 2 to 3 tablets at bedtime for mood and insomnia. Will continue methylphenidate for concentration. Will continue Prozac 20 mg daily 1 to 2 weeks prior to onset of menses for PMDD. Bipolar II disorder (HCC) - Plan: lamoTRIgine (LAMICTAL) 150 MG tablet, QUEtiapine (SEROQUEL) 100 MG tablet  Social anxiety disorder  Attention deficit disorder (ADD) without hyperactivity - Plan: methylphenidate (RITALIN) 20 MG tablet, methylphenidate (RITALIN) 20 MG tablet, methylphenidate (RITALIN) 20 MG tablet  PMDD (premenstrual dysphoric disorder) - Plan: FLUoxetine (PROZAC) 20 MG capsule  Please see After Visit Summary for patient specific instructions.  Future Appointments  Date Time Provider Department Center  04/03/2018  8:30 AM Corie Chiquito, PMHNP [facility] None    No orders of the defined types were placed in this encounter.     -------------------------------

## 2018-03-02 NOTE — Patient Instructions (Addendum)
May want to consider seeing Elio Forget, [facility] for couples counseling.   Recommend re-starting Vitamin D.

## 2018-04-03 ENCOUNTER — Ambulatory Visit: Payer: 59 | Admitting: Psychiatry

## 2018-04-03 ENCOUNTER — Encounter: Payer: Self-pay | Admitting: Psychiatry

## 2018-04-03 VITALS — BP 127/82 | HR 103

## 2018-04-03 DIAGNOSIS — F4010 Social phobia, unspecified: Secondary | ICD-10-CM

## 2018-04-03 DIAGNOSIS — F988 Other specified behavioral and emotional disorders with onset usually occurring in childhood and adolescence: Secondary | ICD-10-CM

## 2018-04-03 DIAGNOSIS — F3181 Bipolar II disorder: Secondary | ICD-10-CM | POA: Diagnosis not present

## 2018-04-03 NOTE — Patient Instructions (Signed)
May want to consider [facility] for [facility].

## 2018-04-03 NOTE — Progress Notes (Signed)
Jessica DavidVanessa G Balz [id number] 03/30/1981 37 y.o.  Subjective:   Patient ID:  Jessica Suarez is a 37 y.o. (DOB 03/30/1981) female.  Chief Complaint:  Chief Complaint  Patient presents with  . Follow-up    h/o Mood, anxiety, ADHD    HPI Jessica Suarez presents to the office today for follow-up of h/o anxiety, mood instability, and ADD. She reports that she and her husband have been communicating more and talking about ways to improve their relationship. Reports that she recently saw obgyn again about infertility issues and is on treatment for this. Reports "I've been pretty anxious" and attributes some of this to work stress (may have to terminate someone this week, dealing with personnel issues). Reports that she has been trying to maintain her composure at work and then sometimes "has a Psychologist, counsellingmeltdown" once she gets home. Reports that she has been able to identify triggers to stress and anxiety. Denies any severe depressed, irritable, or manic moods. She reports that she has been having difficulty concentrating the last few weeks and "I think it's because I have so much" going on at work. Reports that she was able to get more sleep during recent vacation. Denies SI.    Review of Systems:  Review of Systems  Gastrointestinal: Negative.   Musculoskeletal: Negative for gait problem.  Neurological: Negative for tremors.  Psychiatric/Behavioral:       Please refer to HPI    Medications: I have reviewed the patient's current medications.  Current Outpatient Medications  Medication Sig Dispense Refill  . busPIRone (BUSPAR) 15 MG tablet Take 15 mg by mouth 2 (two) times daily.    . clomiPHENE (CLOMID) 50 MG tablet clomiphene citrate 50 mg tablet  Take 2 tablets every day by oral route for 5 days.    Marland Kitchen. FLUoxetine (PROZAC) 20 MG capsule Take 1 capsule po daily 1-2 weeks prior to menses 90 capsule 0  . lamoTRIgine (LAMICTAL) 150 MG tablet Take 1 tablet (150 mg total) by mouth daily. 90 tablet 0  .  levocetirizine (XYZAL) 5 MG tablet Take 5 mg by mouth every evening.    . methylphenidate (RITALIN) 20 MG tablet Take 1/2-1 po BID 60 tablet 0  . [START ON 04/24/2018] methylphenidate (RITALIN) 20 MG tablet Take 1/2-1 po BID 60 tablet 0  . methylphenidate (RITALIN) 20 MG tablet Take 1/2-1 po BID 60 tablet 0  . Oxybutynin (OXYTROL TD) oxybutynin  5 mg    . QUEtiapine (SEROQUEL) 100 MG tablet Take 2-3 tabs po QHS 270 tablet 1  . APRI 0.15-30 MG-MCG tablet TAKE 1 TABLET EVERY DAY CONTINUOUSLY FOR 3 MONTHS  1  . EPINEPHrine (EPIPEN 2-PAK) 0.3 mg/0.3 mL IJ SOAJ injection Inject into the muscle as needed (INJECTION PROTOCOL).    Marland Kitchen. ibuprofen (ADVIL,MOTRIN) 600 MG tablet TAKE 1 TABLET EVERY 6 TO 8 HOURS AS NEEDED FOR PAIN  0  . oxybutynin (DITROPAN) 5 MG tablet      No current facility-administered medications for this visit.     Medication Side Effects: None  Allergies: No Known Allergies  Past Medical History:  Diagnosis Date  . Allergic rhinitis   . Depression   . History of Hodgkin's lymphoma MAY 2004  . IBS (irritable bowel syndrome)   . Migraines   . Sleep disorder     Family History  Problem Relation Age of Onset  . Hypertension Mother   . Hypercholesterolemia Mother   . Asthma Father   . Diabetes Father   . Asthma  Brother   . Bipolar disorder Brother   . Suicidality Brother   . Drug abuse Paternal Aunt   . Aneurysm Paternal Aunt   . Anxiety disorder Other   . Cancer Paternal Aunt     Social History   Socioeconomic History  . Marital status: Single    Spouse name: Not on file  . Number of children: Not on file  . Years of education: Not on file  . Highest education level: Not on file  Occupational History  . Not on file  Social Needs  . Financial resource strain: Not on file  . Food insecurity:    Worry: Not on file    Inability: Not on file  . Transportation needs:    Medical: Not on file    Non-medical: Not on file  Tobacco Use  . Smoking status:  Passive Smoke Exposure - Never Smoker  . Smokeless tobacco: Never Used  Substance and Sexual Activity  . Alcohol use: Yes    Alcohol/week: 2.0 standard drinks    Types: 2 Shots of liquor per week  . Drug use: No  . Sexual activity: Yes    Birth control/protection: None  Lifestyle  . Physical activity:    Days per week: Not on file    Minutes per session: Not on file  . Stress: Not on file  Relationships  . Social connections:    Talks on phone: Not on file    Gets together: Not on file    Attends religious service: Not on file    Active member of club or organization: Not on file    Attends meetings of clubs or organizations: Not on file    Relationship status: Not on file  . Intimate partner violence:    Fear of current or ex partner: Not on file    Emotionally abused: Not on file    Physically abused: Not on file    Forced sexual activity: Not on file  Other Topics Concern  . Not on file  Social History Narrative  . Not on file    Past Medical History, Surgical history, Social history, and Family history were reviewed and updated as appropriate.   Please see review of systems for further details on the patient's review from today.   Objective:   Physical Exam:  BP 127/82   Pulse (!) 103   Physical Exam  Constitutional: She is oriented to person, place, and time. She appears well-developed. No distress.  Musculoskeletal: She exhibits no deformity.  Neurological: She is alert and oriented to person, place, and time. Coordination normal.  Psychiatric: She has a normal mood and affect. Her speech is normal and behavior is normal. Judgment and thought content normal. Her mood appears not anxious. Her affect is not angry, not blunt, not labile and not inappropriate. Cognition and memory are normal. She does not exhibit a depressed mood. She expresses no homicidal and no suicidal ideation. She expresses no suicidal plans and no homicidal plans.  Insight intact. No auditory  or visual hallucinations. No delusions.     Lab Review:  No results found for: NA, K, CL, CO2, GLUCOSE, BUN, CREATININE, CALCIUM, PROT, ALBUMIN, AST, ALT, ALKPHOS, BILITOT, GFRNONAA, GFRAA  No results found for: WBC, RBC, HGB, HCT, PLT, MCV, MCH, MCHC, RDW, LYMPHSABS, MONOABS, EOSABS, BASOSABS  No results found for: POCLITH, LITHIUM   No results found for: PHENYTOIN, PHENOBARB, VALPROATE, CBMZ   .res Assessment: Plan:   Continue current medications. Reiterated that she should  stop Methylphenidate if she becomes pregnant. Pt reports that she does not need refills at this time.   Bipolar II disorder (HCC) - Chronic, stable  Social anxiety disorder - Chronic  Attention deficit disorder (ADD) without hyperactivity - Chronic, stable  Please see After Visit Summary for patient specific instructions.  Future Appointments  Date Time Provider Department Center  05/09/2018  9:00 AM Corie Chiquito, PMHNP [facility] None    No orders of the defined types were placed in this encounter.     -------------------------------

## 2018-05-07 DIAGNOSIS — N979 Female infertility, unspecified: Secondary | ICD-10-CM | POA: Diagnosis not present

## 2018-05-09 ENCOUNTER — Encounter: Payer: Self-pay | Admitting: Psychiatry

## 2018-05-09 ENCOUNTER — Ambulatory Visit: Payer: 59 | Admitting: Psychiatry

## 2018-05-09 DIAGNOSIS — F3281 Premenstrual dysphoric disorder: Secondary | ICD-10-CM | POA: Diagnosis not present

## 2018-05-09 DIAGNOSIS — F4010 Social phobia, unspecified: Secondary | ICD-10-CM

## 2018-05-09 DIAGNOSIS — R69 Illness, unspecified: Secondary | ICD-10-CM | POA: Diagnosis not present

## 2018-05-09 DIAGNOSIS — F988 Other specified behavioral and emotional disorders with onset usually occurring in childhood and adolescence: Secondary | ICD-10-CM

## 2018-05-09 DIAGNOSIS — F3181 Bipolar II disorder: Secondary | ICD-10-CM

## 2018-05-09 MED ORDER — BUSPIRONE HCL 15 MG PO TABS: 15.0000 mg | ORAL_TABLET | Freq: Two times a day (BID) | ORAL | 1 refills | Status: DC

## 2018-05-09 MED ORDER — METHYLPHENIDATE HCL 20 MG PO TABS: ORAL_TABLET | ORAL | 0 refills | Status: DC

## 2018-05-09 MED ORDER — LAMOTRIGINE 150 MG PO TABS: 150.0000 mg | ORAL_TABLET | Freq: Every day | ORAL | 0 refills | Status: DC

## 2018-05-09 MED ORDER — FLUOXETINE HCL 20 MG PO CAPS: ORAL_CAPSULE | ORAL | 0 refills | Status: DC

## 2018-05-09 NOTE — Progress Notes (Signed)
Jessica Suarez [id number] 25-May-1980 38 y.o.  Subjective:   Patient ID:  Jessica Suarez is a 38 y.o. (DOB 25-May-1980) female.  Chief Complaint: No chief complaint on file.   HPI Jessica Suarez presents to the office today for follow-up of mood, anxiety, concentration, and insomnia. She reports that there continues to be communication issues at work and describes being accountable for responsibilities that she is unaware of. Reports that they had multiple holiday gatherings and this was stressful for her. She reports that she experienced the start of a panic attack at a packed holiday gathering and had to go to an area of the house that was less crowded. Reports that there was conflict at one gathering and she felt uncomfortable and asked her husband if they could go immediately. Reports that she continues to have some marital stressors. She reports, "I have swings" and times where she does not want to go to work. She reports that there  are other days that she is more motivated and wanting to take on multiple projects, both at home and at work. For example, on some of her days off she is cleaning and cooking and other days where she wants to sleep 12 hours. Reports that sleep amount fluctuates. She reports that work schedule and demands frequently change. She reports recent binge eating. Reports that she often is not eating at work. Denies SI.   Reports infertility issues and this is upsetting to her.   Past Medication Trials: Latuda- "did not like how it made me feel" Seroquel Wellbutrin-ineffective Zoloft Effexor Prozac-helpful for PMDD signs and symptoms Lexapro Provigil-not as helpful for daytime somnolence Methylphenidate BuSpar-helpful for anxiety Lamictal-helpful for mood  Review of Systems:  Review of Systems  HENT: Positive for congestion.        Recent cold s/s that are gradually improving.   Musculoskeletal: Negative for gait problem.  Neurological: Negative for tremors.   Psychiatric/Behavioral:       Please refer to HPI    Medications: I have reviewed the patient's current medications.  Current Outpatient Medications  Medication Sig Dispense Refill  . busPIRone (BUSPAR) 15 MG tablet Take 1 tablet (15 mg total) by mouth 2 (two) times daily. 180 tablet 1  . clomiPHENE (CLOMID) 50 MG tablet clomiphene citrate 50 mg tablet  Take 2 tablets every day by oral route for 5 days.    Marland Kitchen. EPINEPHrine (EPIPEN 2-PAK) 0.3 mg/0.3 mL IJ SOAJ injection Inject into the muscle as needed (INJECTION PROTOCOL).    Marland Kitchen. FLUoxetine (PROZAC) 20 MG capsule Take 1 capsule po daily 1-2 weeks prior to menses 90 capsule 0  . ibuprofen (ADVIL,MOTRIN) 600 MG tablet TAKE 1 TABLET EVERY 6 TO 8 HOURS AS NEEDED FOR PAIN  0  . lamoTRIgine (LAMICTAL) 150 MG tablet Take 1 tablet (150 mg total) by mouth daily. 90 tablet 0  . levocetirizine (XYZAL) 5 MG tablet Take 5 mg by mouth every evening.    Melene Muller. [START ON 07/13/2018] methylphenidate (RITALIN) 20 MG tablet Take 1/2-1 po BID 60 tablet 0  . oxybutynin (DITROPAN) 5 MG tablet     . QUEtiapine (SEROQUEL) 100 MG tablet Take 2-3 tabs po QHS 270 tablet 1  . APRI 0.15-30 MG-MCG tablet TAKE 1 TABLET EVERY DAY CONTINUOUSLY FOR 3 MONTHS  1  . [START ON 06/15/2018] methylphenidate (RITALIN) 20 MG tablet Take 1/2-1 po BID 60 tablet 0  . [START ON 05/18/2018] methylphenidate (RITALIN) 20 MG tablet Take 1/2-1 po BID 60 tablet 0  .  Oxybutynin (OXYTROL TD) oxybutynin  5 mg     No current facility-administered medications for this visit.     Medication Side Effects: None  Allergies: No Known Allergies  Past Medical History:  Diagnosis Date  . Allergic rhinitis   . Depression   . History of Hodgkin's lymphoma MAY 2004  . IBS (irritable bowel syndrome)   . Migraines   . Sleep disorder     Family History  Problem Relation Age of Onset  . Hypertension Mother   . Hypercholesterolemia Mother   . Asthma Father   . Diabetes Father   . Asthma Brother   .  Bipolar disorder Brother   . Suicidality Brother   . Drug abuse Paternal Aunt   . Aneurysm Paternal Aunt   . Anxiety disorder Other   . Cancer Paternal Aunt     Social History   Socioeconomic History  . Marital status: Single    Spouse name: Not on file  . Number of children: Not on file  . Years of education: Not on file  . Highest education level: Not on file  Occupational History  . Not on file  Social Needs  . Financial resource strain: Not on file  . Food insecurity:    Worry: Not on file    Inability: Not on file  . Transportation needs:    Medical: Not on file    Non-medical: Not on file  Tobacco Use  . Smoking status: Passive Smoke Exposure - Never Smoker  . Smokeless tobacco: Never Used  Substance and Sexual Activity  . Alcohol use: Yes    Alcohol/week: 2.0 standard drinks    Types: 2 Shots of liquor per week  . Drug use: No  . Sexual activity: Yes    Birth control/protection: None  Lifestyle  . Physical activity:    Days per week: Not on file    Minutes per session: Not on file  . Stress: Not on file  Relationships  . Social connections:    Talks on phone: Not on file    Gets together: Not on file    Attends religious service: Not on file    Active member of club or organization: Not on file    Attends meetings of clubs or organizations: Not on file    Relationship status: Not on file  . Intimate partner violence:    Fear of current or ex partner: Not on file    Emotionally abused: Not on file    Physically abused: Not on file    Forced sexual activity: Not on file  Other Topics Concern  . Not on file  Social History Narrative  . Not on file    Past Medical History, Surgical history, Social history, and Family history were reviewed and updated as appropriate.   Please see review of systems for further details on the patient's review from today.   Objective:   Physical Exam:  There were no vitals taken for this visit.  Physical  Exam Constitutional:      General: She is not in acute distress.    Appearance: She is well-developed.  Musculoskeletal:        General: No deformity.  Neurological:     Mental Status: She is alert and oriented to person, place, and time.     Coordination: Coordination normal.  Psychiatric:        Mood and Affect: Mood is not depressed. Affect is not labile, blunt, angry or inappropriate.  Speech: Speech normal.        Behavior: Behavior normal.        Thought Content: Thought content normal. Thought content does not include homicidal or suicidal ideation. Thought content does not include homicidal or suicidal plan.        Judgment: Judgment normal.     Comments: Mood appropriate to content.  Affect is congruent. Insight intact. No auditory or visual hallucinations. No delusions.      Lab Review:  No results found for: NA, K, CL, CO2, GLUCOSE, BUN, CREATININE, CALCIUM, PROT, ALBUMIN, AST, ALT, ALKPHOS, BILITOT, GFRNONAA, GFRAA  No results found for: WBC, RBC, HGB, HCT, PLT, MCV, MCH, MCHC, RDW, LYMPHSABS, MONOABS, EOSABS, BASOSABS  No results found for: POCLITH, LITHIUM   No results found for: PHENYTOIN, PHENOBARB, VALPROATE, CBMZ   .res Assessment: Plan:   Will continue BuSpar 50 mg twice daily for anxiety Will continue Lamictal 150 mg daily for mood. Will continue Seroquel 100 mg 2 to 3 tablets at bedtime for mood and insomnia. Will continue methylphenidate for concentration. Will continue Prozac 20 mg daily 1 to 2 weeks prior to onset of menses for PMDD. Bipolar II disorder (HCC) - Plan: lamoTRIgine (LAMICTAL) 150 MG tablet  PMDD (premenstrual dysphoric disorder) - Plan: FLUoxetine (PROZAC) 20 MG capsule  Social anxiety disorder - Plan: busPIRone (BUSPAR) 15 MG tablet  Attention deficit disorder (ADD) without hyperactivity - Plan: methylphenidate (RITALIN) 20 MG tablet, methylphenidate (RITALIN) 20 MG tablet, methylphenidate (RITALIN) 20 MG tablet  Please see  After Visit Summary for patient specific instructions.  Future Appointments  Date Time Provider Department Center  06/04/2018  8:45 AM Corie Chiquito, PMHNP [facility] None    No orders of the defined types were placed in this encounter.     -------------------------------

## 2018-06-04 ENCOUNTER — Ambulatory Visit: Payer: 59 | Admitting: Psychiatry

## 2018-08-06 IMAGING — RF DG HYSTEROGRAM
5 series · 5 of 5 positions shown · IV contrast (omnipaque)
Comparison: None.

FLUOROSCOPY TIME:  Fluoroscopy Time:  1 minute

CLINICAL DATA: Infertility.

EXAM:
HYSTEROSALPINGOGRAM
TECHNIQUE: Following cleansing of the cervix and vagina with Betadine solution,
a hysterosalpingogram was performed using a 5-French
hysterosalpingogram catheter and Omnipaque 300 contrast. The patient
tolerated the examination without difficulty.

[Series 2: run · 1 of 1 slices shown (1 of 5)]
[im 1/1]
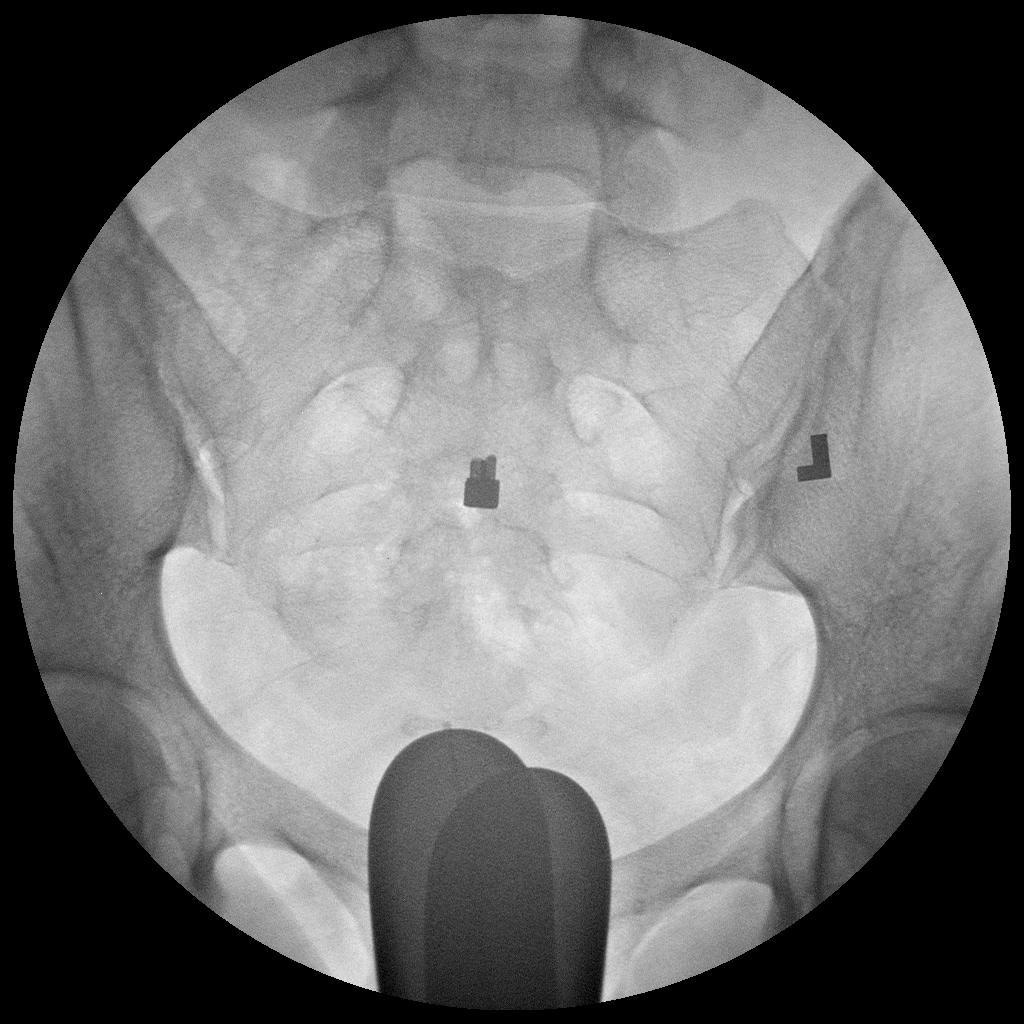

[Series 3: run · 1 of 1 slices shown (2 of 5)]
[im 1/1]
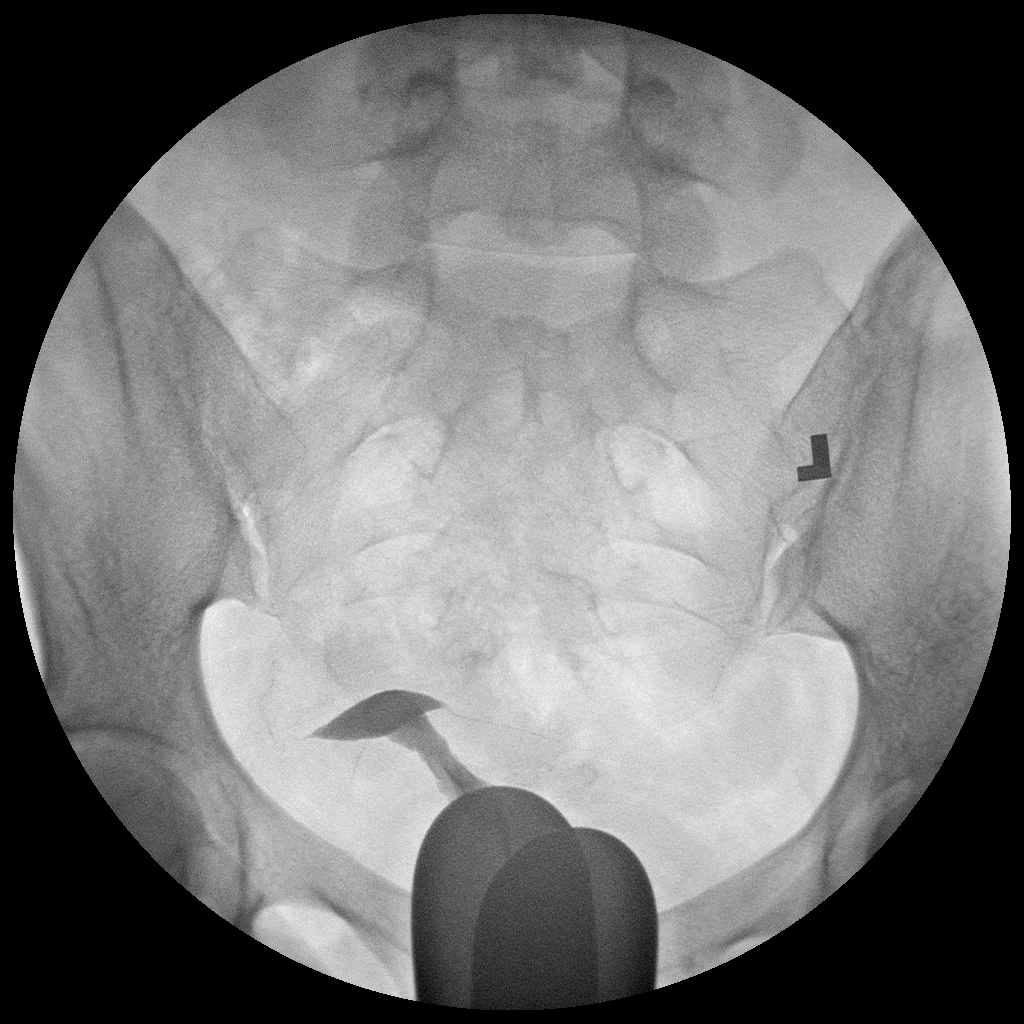

[Series 4: run · 1 of 1 slices shown (3 of 5)]
[im 1/1]
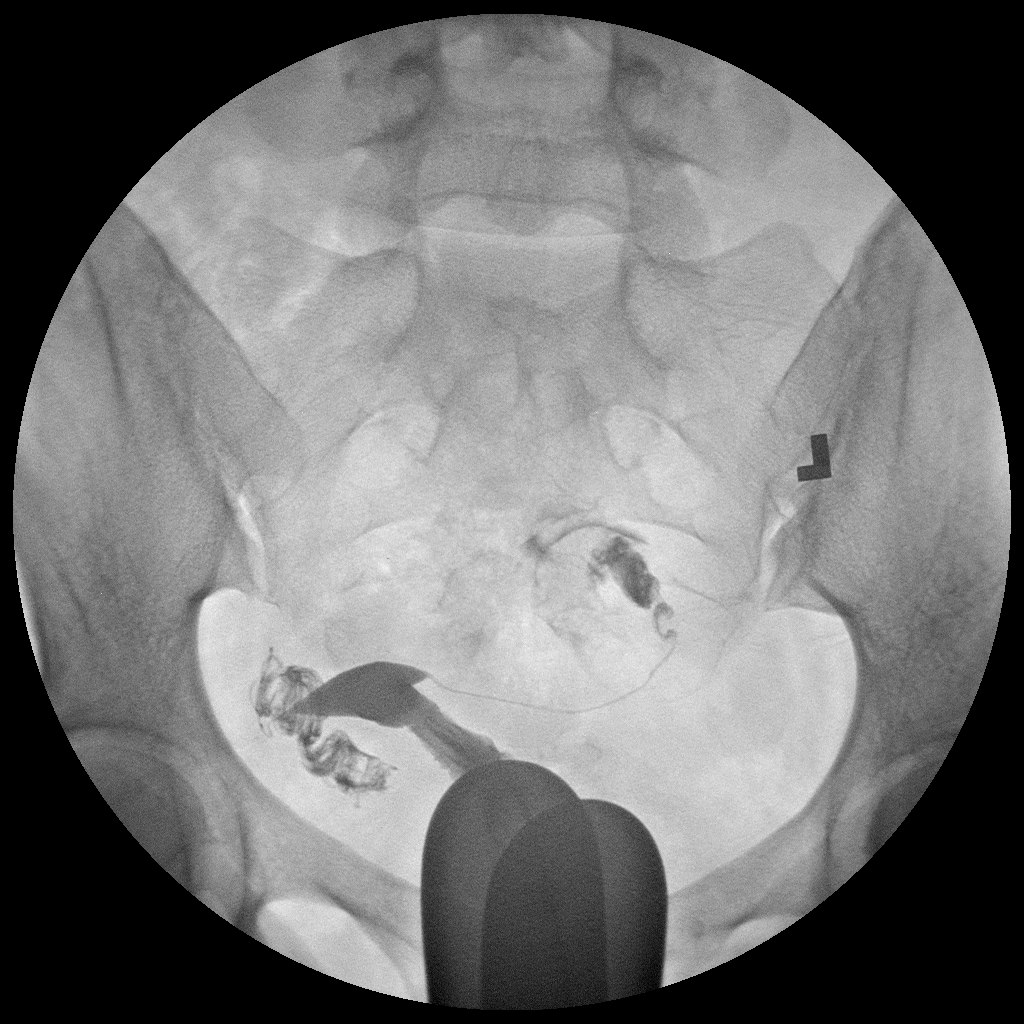

[Series 5: run · 1 of 1 slices shown (4 of 5)]
[im 1/1]
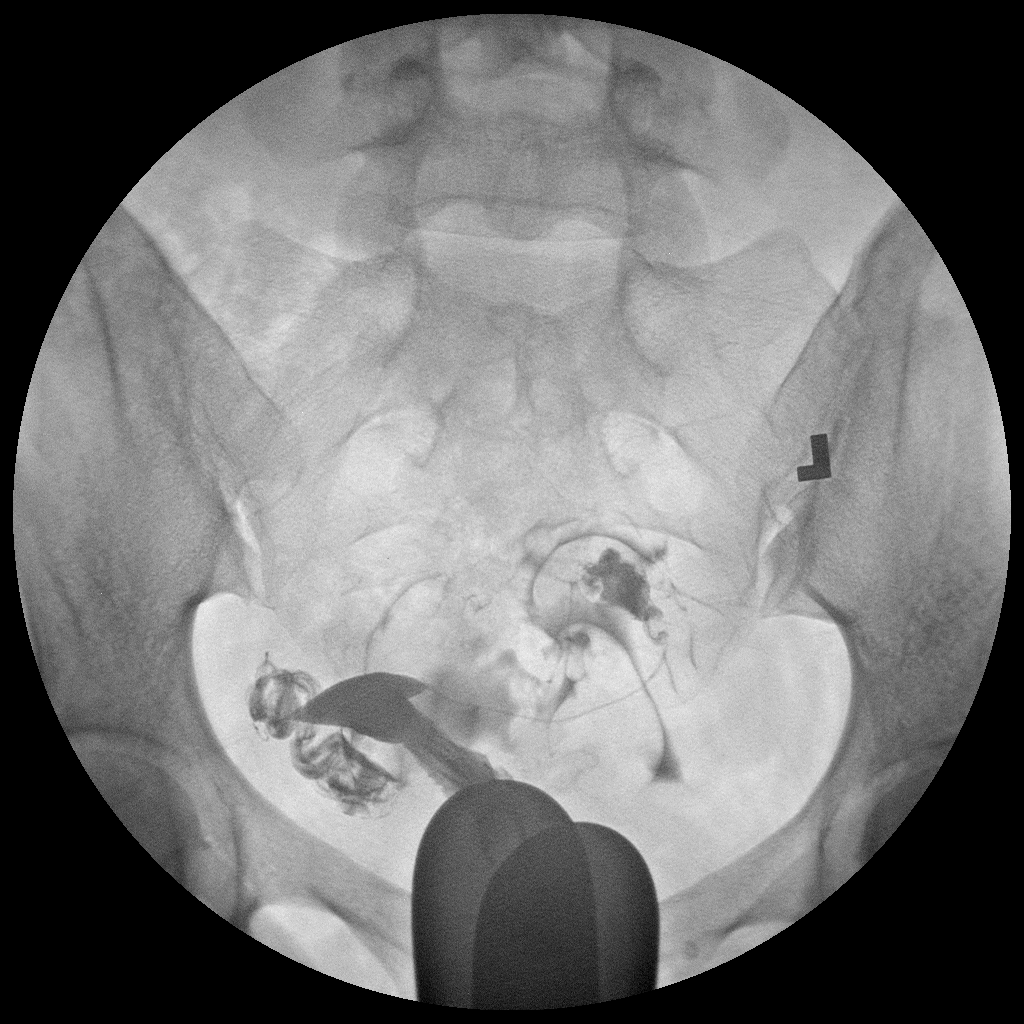

[Series 6: run · 1 of 1 slices shown (5 of 5)]
[im 1/1]
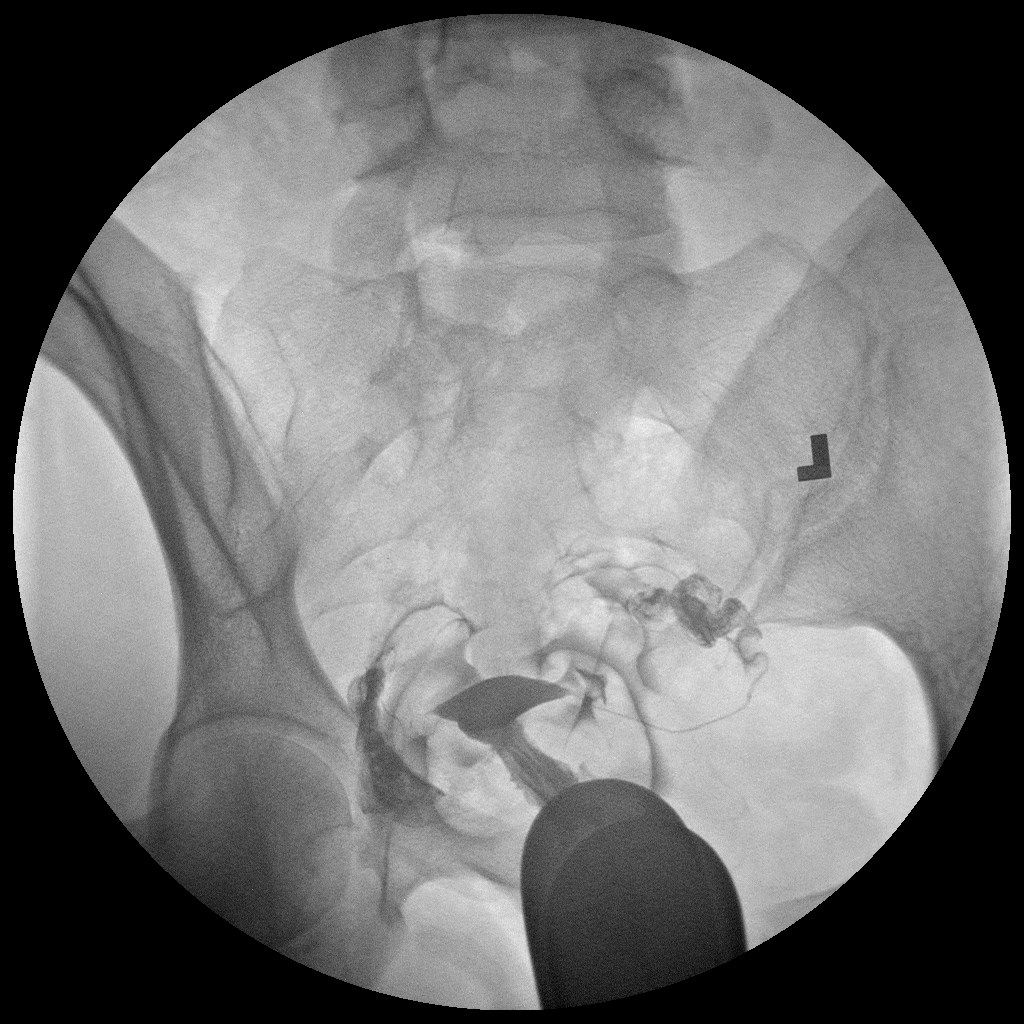

[5 of 5 positions shown; findings below may reference images not displayed]

FINDINGS: The endometrial cavity is normal in appearance and contour.
Opacification of both fallopian tubes is seen. Both tubes appear
normal. Intraperitoneal spill of contrast from both fallopian tubes
is demonstrated.
IMPRESSION: Patent fallopian tubes bilaterally.

## 2018-08-17 ENCOUNTER — Telehealth: Payer: Self-pay | Admitting: Psychiatry

## 2018-08-17 NOTE — Telephone Encounter (Signed)
Irene to schedule appt which is 09/10/18.  She needs refill on her Ritilan. [facility] its not due until 4/27 but requesting it be put on file with the pharmacy so it will be ready when due.  [facility] [address]

## 2018-08-24 ENCOUNTER — Other Ambulatory Visit: Payer: Self-pay

## 2018-08-24 DIAGNOSIS — F988 Other specified behavioral and emotional disorders with onset usually occurring in childhood and adolescence: Secondary | ICD-10-CM

## 2018-08-24 MED ORDER — METHYLPHENIDATE HCL 20 MG PO TABS: ORAL_TABLET | ORAL | 0 refills | Status: DC

## 2018-08-24 NOTE — Telephone Encounter (Signed)
Submitted

## 2018-09-09 ENCOUNTER — Other Ambulatory Visit: Payer: Self-pay | Admitting: Psychiatry

## 2018-09-09 DIAGNOSIS — F3281 Premenstrual dysphoric disorder: Secondary | ICD-10-CM

## 2018-09-10 ENCOUNTER — Encounter: Payer: Self-pay | Admitting: Psychiatry

## 2018-09-10 ENCOUNTER — Other Ambulatory Visit: Payer: Self-pay

## 2018-09-10 ENCOUNTER — Ambulatory Visit: Payer: 59 | Admitting: Psychiatry

## 2018-09-10 DIAGNOSIS — F988 Other specified behavioral and emotional disorders with onset usually occurring in childhood and adolescence: Secondary | ICD-10-CM | POA: Diagnosis not present

## 2018-09-10 DIAGNOSIS — F4010 Social phobia, unspecified: Secondary | ICD-10-CM | POA: Diagnosis not present

## 2018-09-10 DIAGNOSIS — F3281 Premenstrual dysphoric disorder: Secondary | ICD-10-CM

## 2018-09-10 DIAGNOSIS — R69 Illness, unspecified: Secondary | ICD-10-CM | POA: Diagnosis not present

## 2018-09-10 DIAGNOSIS — F3181 Bipolar II disorder: Secondary | ICD-10-CM | POA: Diagnosis not present

## 2018-09-10 MED ORDER — LAMOTRIGINE 150 MG PO TABS: 150.0000 mg | ORAL_TABLET | Freq: Every day | ORAL | 0 refills | Status: DC

## 2018-09-10 MED ORDER — METHYLPHENIDATE HCL 20 MG PO TABS: ORAL_TABLET | ORAL | 0 refills | Status: DC

## 2018-09-10 MED ORDER — QUETIAPINE FUMARATE 100 MG PO TABS: ORAL_TABLET | ORAL | 1 refills | Status: DC

## 2018-09-10 NOTE — Progress Notes (Signed)
Jessica DavidVanessa G Pagliarulo [id number] 17-Mar-1981 38 y.o.  Virtual Visit via Video Note  I connected with pt on 09/10/18 at  8:00 AM EDT by a video enabled telemedicine application and verified that I am speaking with the correct person using two identifiers.   I discussed the limitations of evaluation and management by telemedicine and the availability of in person appointments. The patient expressed understanding and agreed to proceed.  I discussed the assessment and treatment plan with the patient. The patient was provided an opportunity to ask questions and all were answered. The patient agreed with the plan and demonstrated an understanding of the instructions.   The patient was advised to call back or seek an in-person evaluation if the symptoms worsen or if the condition fails to improve as anticipated.  I provided 30 minutes of non-face-to-face time during this encounter.  The patient was located at home.  The provider was located at home.   Corie ChiquitoJessica Everlee Quakenbush, PMHNP   Subjective:   Patient ID:  Jessica Suarez is a 38 y.o. (DOB 17-Mar-1981) female.  Chief Complaint:  Chief Complaint  Patient presents with  . Depression  . Anxiety  . ADD    HPI Jessica DavidVanessa G Schueller presents to the office today for follow-up of mood, anxiety, and ADD.  She reports, "There have been ups and downs. There have been some downs." She reports that she learned her progesterone levels were low and this caused her some depression. Also learned that her infertility specialist is retiring. Reports that she had a period of depression where she had difficulty leaving home and cancelled personal appointments and had some difficulty with work attendance and Engineer, drillingpunctuality. She reports that her mood has been improving some. 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 She reports that she has had a temporary pay cut in response to COVID and  is expected to work same amount. She reports that she was having anxiety with going to work with supervisor distracting her. She reports that there were times she has had to refrain from speaking her mind. Describes mood as "up and down." Reports that she has had irregular menstrual  periods and hormones. She reports that her energy and motivation have been low with occasional times of adequate energy. One employee did not show for work and they called his emergency contact and employee was found dead. She reports that she has had difficulty sleeping since then and then increased amount of Seroquel to 300 mg po QHS about 2 weeks. She reports that she has not excessive drowsiness with higher dose of Seroquel. She thinks that this has been helpful for her mood and anxiety. Describes appetite as "ok." Reports that she tends to "binge eat" after work and attributes this to not being able to enjoy her food at work. "I still would consider myself depressed."   Denies SI.   Reports that she has had some manic s/s to include compulsive cleaning and organizing. Reports that she had a brief period where she was buying frozen food and supplies with the pandemic. She reports that she spent more than usual during that time.    Has been out of Buspar for about a week.   Review of Systems:  Review of Systems  Genitourinary: Positive for menstrual problem.  Musculoskeletal: Negative for gait problem.  Neurological: Negative for tremors.  Psychiatric/Behavioral:       Please refer to HPI  Medications: I have reviewed the patient's current medications.  Current Outpatient Medications  Medication Sig Dispense Refill  . FLUoxetine (PROZAC) 20 MG capsule TAKE 1 CAPSULE BY MOUTH DAILY 1-2 WEEKS PRIOR TO MENSES 90 capsule 0  . levocetirizine (XYZAL) 5 MG tablet Take 5 mg by mouth every evening.    . methylphenidate (RITALIN) 20 MG tablet Take 1/2-1 po BID 60 tablet 0  . methylphenidate (RITALIN) 20 MG tablet Take  1/2-1 po BID 60 tablet 0  . [START ON 09/21/2018] methylphenidate (RITALIN) 20 MG tablet Take 1/2-1 po BID 60 tablet 0  . oxybutynin (DITROPAN) 5 MG tablet     . Oxybutynin (OXYTROL TD) oxybutynin  5 mg    . QUEtiapine (SEROQUEL) 100 MG tablet Take 2-3 tabs po QHS 270 tablet 1  . APRI 0.15-30 MG-MCG tablet TAKE 1 TABLET EVERY DAY CONTINUOUSLY FOR 3 MONTHS  1  . busPIRone (BUSPAR) 15 MG tablet Take 1 tablet (15 mg total) by mouth 2 (two) times daily. 180 tablet 1  . clomiPHENE (CLOMID) 50 MG tablet clomiphene citrate 50 mg tablet  Take 2 tablets every day by oral route for 5 days.    Marland Kitchen EPINEPHrine (EPIPEN 2-PAK) 0.3 mg/0.3 mL IJ SOAJ injection Inject into the muscle as needed (INJECTION PROTOCOL).    Marland Kitchen ibuprofen (ADVIL,MOTRIN) 600 MG tablet TAKE 1 TABLET EVERY 6 TO 8 HOURS AS NEEDED FOR PAIN  0  . lamoTRIgine (LAMICTAL) 150 MG tablet Take 1 tablet (150 mg total) by mouth daily. 90 tablet 0   No current facility-administered medications for this visit.     Medication Side Effects: None  Allergies: No Known Allergies  Past Medical History:  Diagnosis Date  . Allergic rhinitis   . Depression   . History of Hodgkin's lymphoma MAY 2004  . IBS (irritable bowel syndrome)   . Migraines   . Sleep disorder     Family History  Problem Relation Age of Onset  . Hypertension Mother   . Hypercholesterolemia Mother   . Asthma Father   . Diabetes Father   . Asthma Brother   . Bipolar disorder Brother   . Suicidality Brother   . Drug abuse Paternal Aunt   . Aneurysm Paternal Aunt   . Anxiety disorder Other   . Cancer Paternal Aunt     Social History   Socioeconomic History  . Marital status: Single    Spouse name: Not on file  . Number of children: Not on file  . Years of education: Not on file  . Highest education level: Not on file  Occupational History  . Not on file  Social Needs  . Financial resource strain: Not on file  . Food insecurity:    Worry: Not on file     Inability: Not on file  . Transportation needs:    Medical: Not on file    Non-medical: Not on file  Tobacco Use  . Smoking status: Passive Smoke Exposure - Never Smoker  . Smokeless tobacco: Never Used  Substance and Sexual Activity  . Alcohol use: Yes    Alcohol/week: 2.0 standard drinks    Types: 2 Shots of liquor per week  . Drug use: No  . Sexual activity: Yes    Birth control/protection: None  Lifestyle  . Physical activity:    Days per week: Not on file    Minutes per session: Not on file  . Stress: Not on file  Relationships  . Social connections:    Talks  on phone: Not on file    Gets together: Not on file    Attends religious service: Not on file    Active member of club or organization: Not on file    Attends meetings of clubs or organizations: Not on file    Relationship status: Not on file  . Intimate partner violence:    Fear of current or ex partner: Not on file    Emotionally abused: Not on file    Physically abused: Not on file    Forced sexual activity: Not on file  Other Topics Concern  . Not on file  Social History Narrative  . Not on file    Past Medical History, Surgical history, Social history, and Family history were reviewed and updated as appropriate.   Please see review of systems for further details on the patient's review from today.   Objective:   Physical Exam:  There were no vitals taken for this visit.  Physical Exam Neurological:     Mental Status: She is alert and oriented to person, place, and time.     Cranial Nerves: No dysarthria.  Psychiatric:        Attention and Perception: Attention normal.        Mood and Affect: Mood is anxious.        Speech: Speech normal.        Behavior: Behavior is cooperative.        Thought Content: Thought content normal. Thought content is not paranoid or delusional. Thought content does not include homicidal or suicidal ideation. Thought content does not include homicidal or suicidal plan.         Cognition and Memory: Cognition and memory normal.        Judgment: Judgment normal.     Lab Review:  No results found for: NA, K, CL, CO2, GLUCOSE, BUN, CREATININE, CALCIUM, PROT, ALBUMIN, AST, ALT, ALKPHOS, BILITOT, GFRNONAA, GFRAA  No results found for: WBC, RBC, HGB, HCT, PLT, MCV, MCH, MCHC, RDW, LYMPHSABS, MONOABS, EOSABS, BASOSABS  No results found for: POCLITH, LITHIUM   No results found for: PHENYTOIN, PHENOBARB, VALPROATE, CBMZ   .res Assessment: Plan:   Discussed treatment plan with patient and she reports, "I want to see what things are like when I get back into a routine" and taking Seroquel 300 mg QHS.  Agreed with plan to take higher dose of Seroquel since this is more likely to be effective for her mood, anxiety, and insomnia. Continue BuSpar 15 mg twice daily for anxiety Continue Lamictal 150 mg daily. Continue Ritalin 20 mg 1/2-1 tab p.o. twice daily for concentration and attention. Patient advised to contact office with any questions, adverse effects, or acute worsening in signs and symptoms.  Bipolar II disorder (HCC) - Plan: lamoTRIgine (LAMICTAL) 150 MG tablet, QUEtiapine (SEROQUEL) 100 MG tablet  Social anxiety disorder  PMDD (premenstrual dysphoric disorder)  Bipolar II disorder (HCC) - Chronic with some mild mood cycling - Plan: lamoTRIgine (LAMICTAL) 150 MG tablet, QUEtiapine (SEROQUEL) 100 MG tablet  Attention deficit disorder (ADD) without hyperactivity - Improved with Ritalin - Plan: methylphenidate (RITALIN) 20 MG tablet  Please see After Visit Summary for patient specific instructions.  Future Appointments  Date Time Provider Department Center  10/01/2018  9:00 AM Corie Chiquito, PMHNP [facility] None    No orders of the defined types were placed in this encounter.     -------------------------------

## 2018-10-01 ENCOUNTER — Ambulatory Visit: Payer: 59 | Admitting: Psychiatry

## 2018-10-10 DIAGNOSIS — N979 Female infertility, unspecified: Secondary | ICD-10-CM | POA: Diagnosis not present

## 2018-10-24 ENCOUNTER — Telehealth: Payer: Self-pay | Admitting: Psychiatry

## 2018-10-24 DIAGNOSIS — F988 Other specified behavioral and emotional disorders with onset usually occurring in childhood and adolescence: Secondary | ICD-10-CM

## 2018-10-24 MED ORDER — METHYLPHENIDATE HCL 20 MG PO TABS: ORAL_TABLET | ORAL | 0 refills | Status: DC

## 2018-10-24 NOTE — Telephone Encounter (Signed)
Patient called and made an appointment for 11/12/2018. Patient needs a refill on ritalin 20 mg 1/2 to 1 [facility]

## 2018-11-12 ENCOUNTER — Encounter: Payer: Self-pay | Admitting: Psychiatry

## 2018-11-12 ENCOUNTER — Ambulatory Visit (INDEPENDENT_AMBULATORY_CARE_PROVIDER_SITE_OTHER): Payer: 59 | Admitting: Psychiatry

## 2018-11-12 ENCOUNTER — Other Ambulatory Visit: Payer: Self-pay

## 2018-11-12 DIAGNOSIS — F988 Other specified behavioral and emotional disorders with onset usually occurring in childhood and adolescence: Secondary | ICD-10-CM

## 2018-11-12 DIAGNOSIS — F3181 Bipolar II disorder: Secondary | ICD-10-CM | POA: Diagnosis not present

## 2018-11-12 DIAGNOSIS — F4010 Social phobia, unspecified: Secondary | ICD-10-CM

## 2018-11-12 DIAGNOSIS — R69 Illness, unspecified: Secondary | ICD-10-CM | POA: Diagnosis not present

## 2018-11-12 MED ORDER — BUSPIRONE HCL 15 MG PO TABS: 15.0000 mg | ORAL_TABLET | Freq: Two times a day (BID) | ORAL | 1 refills | Status: DC

## 2018-11-12 MED ORDER — METHYLPHENIDATE HCL 20 MG PO TABS: ORAL_TABLET | ORAL | 0 refills | Status: DC

## 2018-11-12 MED ORDER — LAMOTRIGINE 150 MG PO TABS: 150.0000 mg | ORAL_TABLET | Freq: Every day | ORAL | 0 refills | Status: DC

## 2018-11-12 NOTE — Progress Notes (Signed)
Jessica DavidVanessa G Helming [id number] 1981/02/15 38 y.o.  Virtual Visit via Telephone Note  I connected with pt on 11/12/18 at 12:30 PM EDT by telephone and verified that I am speaking with the correct person using two identifiers.   I discussed the limitations, risks, security and privacy concerns of performing an evaluation and management service by telephone and the availability of in person appointments. I also discussed with the patient that there may be a patient responsible charge related to this service. The patient expressed understanding and agreed to proceed.   I discussed the assessment and treatment plan with the patient. The patient was provided an opportunity to ask questions and all were answered. The patient agreed with the plan and demonstrated an understanding of the instructions.   The patient was advised to call back or seek an in-person evaluation if the symptoms worsen or if the condition fails to improve as anticipated.  I provided 30 minutes of non-face-to-face time during this encounter.  The patient was located at home.  [facility]   Corie ChiquitoJessica Teighlor Korson, PMHNP   Subjective:   Patient ID:  Jessica Suarez is a 38 y.o. (DOB 1981/02/15) female.  Chief Complaint:  Chief Complaint  Patient presents with  . Anxiety  . Follow-up    h/o mood disturbance, inattention, and sleep disturbance    HPI Jessica DavidVanessa G Maimone presents for follow-up of mood, anxiety, and inattention. She reports that she has had some recent anxiety and at times has been frustrated because she is not certain what is triggering her anxiety. Reports that she had some menstrual irregularities around that time and was not having a period until recently. Reports that anxiety has been "more constant." She reports that she has been trying to focus on self-care and make a point to rest and relax. Reports that she also has been trying to take 3 Seroquel tabs when her schedule allows.  She reports that anxiety has improved overall compared to 2-3 weeks ago. She reports that she had increased anxiety after terminating an employee that was unstable. Some anticipatory anxiety before work and worries about "the unknowns" to include staffing issues, what she will need to cover, etc.   She reports that her mood has been "up and down" with some low level depression at baseline. She reports that her energy has been low after times when she has increased demands at work with covering other positions. She reports that when she only has her usual job responsibilities her energy and motivation are better after work. She reports that she is usually sleeping 6-7 hours. She reports that concentration is adequate when she is under less stress. She reports at times she is not able to focus as well when working from home recently. Denies SI.   She reports that her husband developed some congestion and respiratory s/s over the weekend and they both are in the process of being tested for COVID 19.   Has apt with [facility] next month and has some anxiety related to this. Reports that she is trying to remain positive about infertility issues.   Reports that she and her husband are very compatible in some ways and very different from one another in other areas, such as the ways family and friends interact. She reports that this has caused her some anxiety when she interacts with husband's family and friends and reports that she has little in common with them.  Reports that there have been some  recent work stressors and they have been short-staffed.   Past Medication Trials: Latuda- "did not like how it made me feel" Seroquel Wellbutrin-ineffective Zoloft Effexor Prozac-helpful for PMDD signs and symptoms Lexapro Provigil-not as helpful for daytime somnolence Methylphenidate BuSpar-helpful for anxiety Lamictal-helpful for mood  Review of Systems:  Review of Systems  Musculoskeletal:  Negative for gait problem.  Neurological: Negative for tremors.  Psychiatric/Behavioral:       Please refer to HPI    Medications: I have reviewed the patient's current medications.  Current Outpatient Medications  Medication Sig Dispense Refill  . ibuprofen (ADVIL,MOTRIN) 600 MG tablet TAKE 1 TABLET EVERY 6 TO 8 HOURS AS NEEDED FOR PAIN  0  . lamoTRIgine (LAMICTAL) 150 MG tablet Take 1 tablet (150 mg total) by mouth daily. 90 tablet 0  . levocetirizine (XYZAL) 5 MG tablet Take 5 mg by mouth every evening.    . methylphenidate (RITALIN) 20 MG tablet Take 1/2-1 po BID 60 tablet 0  . methylphenidate (RITALIN) 20 MG tablet Take 1/2-1 po BID 60 tablet 0  . [START ON 11/23/2018] methylphenidate (RITALIN) 20 MG tablet Take 1/2-1 po BID 60 tablet 0  . oxybutynin (DITROPAN) 5 MG tablet     . Oxybutynin (OXYTROL TD) oxybutynin  5 mg    . QUEtiapine (SEROQUEL) 100 MG tablet Take 2-3 tabs po QHS 270 tablet 1  . APRI 0.15-30 MG-MCG tablet TAKE 1 TABLET EVERY DAY CONTINUOUSLY FOR 3 MONTHS  1  . busPIRone (BUSPAR) 15 MG tablet Take 1 tablet (15 mg total) by mouth 2 (two) times daily. 180 tablet 1  . clomiPHENE (CLOMID) 50 MG tablet clomiphene citrate 50 mg tablet  Take 2 tablets every day by oral route for 5 days.    Marland Kitchen EPINEPHrine (EPIPEN 2-PAK) 0.3 mg/0.3 mL IJ SOAJ injection Inject into the muscle as needed (INJECTION PROTOCOL).    Marland Kitchen FLUoxetine (PROZAC) 20 MG capsule TAKE 1 CAPSULE BY MOUTH DAILY 1-2 WEEKS PRIOR TO MENSES (Patient not taking: Reported on 11/12/2018) 90 capsule 0   No current facility-administered medications for this visit.     Medication Side Effects: None  Allergies: No Known Allergies  Past Medical History:  Diagnosis Date  . Allergic rhinitis   . Depression   . History of Hodgkin's lymphoma MAY 2004  . IBS (irritable bowel syndrome)   . Migraines   . Sleep disorder     Family History  Problem Relation Age of Onset  . Hypertension Mother   . Hypercholesterolemia  Mother   . Asthma Father   . Diabetes Father   . Asthma Brother   . Bipolar disorder Brother   . Suicidality Brother   . Drug abuse Paternal Aunt   . Aneurysm Paternal Aunt   . Anxiety disorder Other   . Cancer Paternal Aunt     Social History   Socioeconomic History  . Marital status: Single    Spouse name: Not on file  . Number of children: Not on file  . Years of education: Not on file  . Highest education level: Not on file  Occupational History  . Not on file  Social Needs  . Financial resource strain: Not on file  . Food insecurity    Worry: Not on file    Inability: Not on file  . Transportation needs    Medical: Not on file    Non-medical: Not on file  Tobacco Use  . Smoking status: Passive Smoke Exposure - Never Smoker  . Smokeless  tobacco: Never Used  Substance and Sexual Activity  . Alcohol use: Yes    Alcohol/week: 2.0 standard drinks    Types: 2 Shots of liquor per week  . Drug use: No  . Sexual activity: Yes    Birth control/protection: None  Lifestyle  . Physical activity    Days per week: Not on file    Minutes per session: Not on file  . Stress: Not on file  Relationships  . Social Musicianconnections    Talks on phone: Not on file    Gets together: Not on file    Attends religious service: Not on file    Active member of club or organization: Not on file    Attends meetings of clubs or organizations: Not on file    Relationship status: Not on file  . Intimate partner violence    Fear of current or ex partner: Not on file    Emotionally abused: Not on file    Physically abused: Not on file    Forced sexual activity: Not on file  Other Topics Concern  . Not on file  Social History Narrative  . Not on file    Past Medical History, Surgical history, Social history, and Family history were reviewed and updated as appropriate.   Please see review of systems for further details on the patient's review from today.   Objective:   Physical Exam:   There were no vitals taken for this visit.  Physical Exam Constitutional:      General: She is not in acute distress.    Appearance: She is well-developed.  Musculoskeletal:        General: No deformity.  Neurological:     Mental Status: She is alert and oriented to person, place, and time.     Coordination: Coordination normal.  Psychiatric:        Attention and Perception: Attention and perception normal. She does not perceive auditory or visual hallucinations.        Mood and Affect: Mood is anxious. Mood is not depressed. Affect is not labile, blunt, angry or inappropriate.        Speech: Speech normal.        Behavior: Behavior normal.        Thought Content: Thought content normal. Thought content does not include homicidal or suicidal ideation. Thought content does not include homicidal or suicidal plan.        Cognition and Memory: Cognition and memory normal.        Judgment: Judgment normal.     Comments: Insight intact. No delusions.      Lab Review:  No results found for: NA, K, CL, CO2, GLUCOSE, BUN, CREATININE, CALCIUM, PROT, ALBUMIN, AST, ALT, ALKPHOS, BILITOT, GFRNONAA, GFRAA  No results found for: WBC, RBC, HGB, HCT, PLT, MCV, MCH, MCHC, RDW, LYMPHSABS, MONOABS, EOSABS, BASOSABS  No results found for: POCLITH, LITHIUM   No results found for: PHENYTOIN, PHENOBARB, VALPROATE, CBMZ   .res Assessment: Plan:   Discussed that it may be beneficial to take Prozac 20 mg daily to improve anxiety and also for premenstrual dysphoric disorder signs and symptoms since patient reports that menstrual cycle has been irregular and it is difficult to track when she should start and stop Prozac and patient reports that she has also been having difficulty remembering when to take Prozac according to menstrual cycle.  Patient agrees to trial of Prozac 20 mg po qd for PMDD and anxiety.  Will continue all other medications  as prescribed. Patient to follow-up in 4 weeks or sooner if  clinically indicated. Patient advised to contact office with any questions, adverse effects, or acute worsening in signs and symptoms.   Jessica Suarez was seen today for anxiety and follow-up.  Diagnoses and all orders for this visit:  Attention deficit disorder (ADD) without hyperactivity Comments: Improved with Ritalin Orders: -     methylphenidate (RITALIN) 20 MG tablet; Take 1/2-1 po BID  Social anxiety disorder Comments: Chronic Orders: -     busPIRone (BUSPAR) 15 MG tablet; Take 1 tablet (15 mg total) by mouth 2 (two) times daily.  Bipolar II disorder (HCC) Comments: Chronic with some mild mood cycling Orders: -     lamoTRIgine (LAMICTAL) 150 MG tablet; Take 1 tablet (150 mg total) by mouth daily.    Please see After Visit Summary for patient specific instructions.  No future appointments.  No orders of the defined types were placed in this encounter.     -------------------------------

## 2018-11-13 DIAGNOSIS — Z20828 Contact with and (suspected) exposure to other viral communicable diseases: Secondary | ICD-10-CM | POA: Diagnosis not present

## 2018-12-02 ENCOUNTER — Other Ambulatory Visit: Payer: Self-pay | Admitting: Psychiatry

## 2018-12-02 DIAGNOSIS — F3281 Premenstrual dysphoric disorder: Secondary | ICD-10-CM

## 2018-12-06 DIAGNOSIS — N978 Female infertility of other origin: Secondary | ICD-10-CM | POA: Diagnosis not present

## 2018-12-14 DIAGNOSIS — Z3141 Encounter for fertility testing: Secondary | ICD-10-CM | POA: Diagnosis not present

## 2018-12-14 DIAGNOSIS — R638 Other symptoms and signs concerning food and fluid intake: Secondary | ICD-10-CM | POA: Diagnosis not present

## 2018-12-21 ENCOUNTER — Other Ambulatory Visit: Payer: Self-pay

## 2018-12-21 ENCOUNTER — Telehealth: Payer: Self-pay | Admitting: Psychiatry

## 2018-12-21 DIAGNOSIS — F988 Other specified behavioral and emotional disorders with onset usually occurring in childhood and adolescence: Secondary | ICD-10-CM

## 2018-12-21 MED ORDER — METHYLPHENIDATE HCL 20 MG PO TABS: ORAL_TABLET | ORAL | 0 refills | Status: DC

## 2018-12-21 NOTE — Telephone Encounter (Signed)
Pended for approval.

## 2018-12-21 NOTE — Telephone Encounter (Signed)
Patient called and said that she needs arefill on her ritalin 20mg  [facility] [address]. She has an appoitment on Monday 8/24

## 2018-12-24 ENCOUNTER — Ambulatory Visit (INDEPENDENT_AMBULATORY_CARE_PROVIDER_SITE_OTHER): Payer: Managed Care, Other (non HMO) | Admitting: Psychiatry

## 2018-12-24 ENCOUNTER — Other Ambulatory Visit: Payer: Self-pay

## 2018-12-24 ENCOUNTER — Encounter: Payer: Self-pay | Admitting: Psychiatry

## 2018-12-24 VITALS — HR 107

## 2018-12-24 DIAGNOSIS — F988 Other specified behavioral and emotional disorders with onset usually occurring in childhood and adolescence: Secondary | ICD-10-CM

## 2018-12-24 DIAGNOSIS — R69 Illness, unspecified: Secondary | ICD-10-CM | POA: Diagnosis not present

## 2018-12-24 DIAGNOSIS — F4010 Social phobia, unspecified: Secondary | ICD-10-CM

## 2018-12-24 DIAGNOSIS — F3181 Bipolar II disorder: Secondary | ICD-10-CM

## 2018-12-24 MED ORDER — LAMOTRIGINE 150 MG PO TABS: 150.0000 mg | ORAL_TABLET | Freq: Every day | ORAL | 0 refills | Status: DC

## 2018-12-24 MED ORDER — METHYLPHENIDATE HCL 20 MG PO TABS: ORAL_TABLET | ORAL | 0 refills | Status: DC

## 2018-12-24 MED ORDER — PROPRANOLOL HCL 10 MG PO TABS: ORAL_TABLET | ORAL | 1 refills | Status: DC

## 2018-12-24 MED ORDER — QUETIAPINE FUMARATE 100 MG PO TABS: ORAL_TABLET | ORAL | 1 refills | Status: DC

## 2018-12-24 NOTE — Progress Notes (Signed)
Jessica DavidVanessa G Jodoin [id number] 10-23-1980 38 y.o.  Virtual Visit via Telephone Note  I connected with pt on 12/24/18 at 11:30 AM EDT by telephone and verified that I am speaking with the correct person using two identifiers.   I discussed the limitations, risks, security and privacy concerns of performing an evaluation and management service by telephone and the availability of in person appointments. I also discussed with the patient that there may be a patient responsible charge related to this service. The patient expressed understanding and agreed to proceed.   I discussed the assessment and treatment plan with the patient. The patient was provided an opportunity to ask questions and all were answered. The patient agreed with the plan and demonstrated an understanding of the instructions.   The patient was advised to call back or seek an in-person evaluation if the symptoms worsen or if the condition fails to improve as anticipated.  I provided 30 minutes of non-face-to-face time during this encounter.  The patient was located at home.  [facility].   Corie ChiquitoJessica Destenie Ingber, PMHNP   Subjective:   Patient ID:  Jessica Suarez is a 38 y.o. (DOB 10-23-1980) female.  Chief Complaint:  Chief Complaint  Patient presents with  . Anxiety  . Follow-up    Bipolar, PMDD    HPI Jessica DavidVanessa G Sporrer presents for follow-up of Bipolar D/O, Anxiety, PMDD, ADD, and sleep disturbance.  She reports that she tried taking Prozac more consistently and experienced some excessive somnolence, low energy, and low motivation  Reports that she has been "a little obsessive compulsive." Reports that she has been cleaning excessively and organizing for a few weeks. She reports "it helps" and that having her house clean and organized has improved her anxiety and motivation overall. Reports that she saw a lab result in the patient portal and immediately experienced catastrophic thinking and  contacted infertility specialist. She reports that her understanding is that she would need to have an egg donor in order to conceive. She reports that she is continuing to try to process this information and has not talked to her husband yet about this. Reports that she has had some increased anxiety recently. Some anxiety with going into work and attributes this to wanting to perform well at work. Reports that heart rate is likely elevated at times. Noticed that she did not have anxiety at her part-time job at [employer]- "I have confidence there, that I don't necessarily have at the lab." She reports that anxiety seems to improve after 1-2 hours at work and overall anxiety has improved. She reports that her sleep has been "up and down." She reports disturbed sleep over the last week in response to work stress. She reports sleeping excessively over the weekend. Energy has been elevated and reports some increase in goal-directed activity. Denies impulsive or risky behavior. Has been shopping some online. Reports shopping has decreased since start of COVID. She reports that she is now spending $20-$30 periodically. Has been able to pay off some debts. Denies racing thoughts. She reports concentration is "normal" with difficulty focusing if being repeatedly interrupted. Denies SI.   She reports that her mood has been ok. Denies elevated mood. She reports that irritability is consistent with baseline.   Reports some work stressors.   She reports that she has been taking Seroquel 200 mg QHS and rarely taking Prozac.   Past Medication Trials: Latuda-"did not like how it made me feel" Seroquel Wellbutrin-ineffective  Zoloft Effexor Prozac-helpful for PMDD signs and symptoms Lexapro Provigil-not as helpful for daytime somnolence Methylphenidate BuSpar-helpful for anxiety Lamictal-helpful for mood   Review of Systems:  Review of Systems  Cardiovascular: Negative for palpitations.   Musculoskeletal: Negative for gait problem.  Neurological: Negative for tremors.  Psychiatric/Behavioral:       Please refer to HPI    Had apt with [facility] a few weeks ago. Had some tests completed. Reports thyroid was WNL.   Medications: I have reviewed the patient's current medications.  Current Outpatient Medications  Medication Sig Dispense Refill  . busPIRone (BUSPAR) 15 MG tablet Take 1 tablet (15 mg total) by mouth 2 (two) times daily. 180 tablet 1  . ibuprofen (ADVIL,MOTRIN) 600 MG tablet TAKE 1 TABLET EVERY 6 TO 8 HOURS AS NEEDED FOR PAIN  0  . lamoTRIgine (LAMICTAL) 150 MG tablet Take 1 tablet (150 mg total) by mouth daily. 90 tablet 0  . levocetirizine (XYZAL) 5 MG tablet Take 5 mg by mouth every evening.    . methylphenidate (RITALIN) 20 MG tablet Take 1/2-1 po BID 60 tablet 0  . [START ON 02/18/2019] methylphenidate (RITALIN) 20 MG tablet Take 1/2-1 po BID 60 tablet 0  . [START ON 01/21/2019] methylphenidate (RITALIN) 20 MG tablet Take 1/2-1 po BID 60 tablet 0  . oxybutynin (DITROPAN) 5 MG tablet     . Oxybutynin (OXYTROL TD) oxybutynin  5 mg    . QUEtiapine (SEROQUEL) 100 MG tablet Take 2-3 tabs po QHS 270 tablet 1  . APRI 0.15-30 MG-MCG tablet TAKE 1 TABLET EVERY DAY CONTINUOUSLY FOR 3 MONTHS  1  . clomiPHENE (CLOMID) 50 MG tablet clomiphene citrate 50 mg tablet  Take 2 tablets every day by oral route for 5 days.    Marland Kitchen. EPINEPHrine (EPIPEN 2-PAK) 0.3 mg/0.3 mL IJ SOAJ injection Inject into the muscle as needed (INJECTION PROTOCOL).    Marland Kitchen. FLUoxetine (PROZAC) 20 MG capsule TAKE 1 CAPSULE BY MOUTH DAILY 1-2 WEEKS PRIOR TO MENSES (Patient not taking: Reported on 11/12/2018) 90 capsule 0  . propranolol (INDERAL) 10 MG tablet Take 1-2 tabs po BID prn anxiety 120 tablet 1   No current facility-administered medications for this visit.     Medication Side Effects: None  Allergies: No Known Allergies  Past Medical History:  Diagnosis Date  . Allergic rhinitis   .  Depression   . History of Hodgkin's lymphoma MAY 2004  . IBS (irritable bowel syndrome)   . Migraines   . Sleep disorder     Family History  Problem Relation Age of Onset  . Hypertension Mother   . Hypercholesterolemia Mother   . Asthma Father   . Diabetes Father   . Asthma Brother   . Bipolar disorder Brother   . Suicidality Brother   . Drug abuse Paternal Aunt   . Aneurysm Paternal Aunt   . Anxiety disorder Other   . Cancer Paternal Aunt     Social History   Socioeconomic History  . Marital status: Single    Spouse name: Not on file  . Number of children: Not on file  . Years of education: Not on file  . Highest education level: Not on file  Occupational History  . Not on file  Social Needs  . Financial resource strain: Not on file  . Food insecurity    Worry: Not on file    Inability: Not on file  . Transportation needs    Medical: Not on file  Non-medical: Not on file  Tobacco Use  . Smoking status: Passive Smoke Exposure - Never Smoker  . Smokeless tobacco: Never Used  Substance and Sexual Activity  . Alcohol use: Yes    Alcohol/week: 2.0 standard drinks    Types: 2 Shots of liquor per week  . Drug use: No  . Sexual activity: Yes    Birth control/protection: None  Lifestyle  . Physical activity    Days per week: Not on file    Minutes per session: Not on file  . Stress: Not on file  Relationships  . Social Herbalist on phone: Not on file    Gets together: Not on file    Attends religious service: Not on file    Active member of club or organization: Not on file    Attends meetings of clubs or organizations: Not on file    Relationship status: Not on file  . Intimate partner violence    Fear of current or ex partner: Not on file    Emotionally abused: Not on file    Physically abused: Not on file    Forced sexual activity: Not on file  Other Topics Concern  . Not on file  Social History Narrative  . Not on file    Past Medical  History, Surgical history, Social history, and Family history were reviewed and updated as appropriate.   Please see review of systems for further details on the patient's review from today.   Objective:   Physical Exam:  Pulse (!) 107   Physical Exam Neurological:     Mental Status: She is alert and oriented to person, place, and time.     Cranial Nerves: No dysarthria.  Psychiatric:        Attention and Perception: Attention normal.        Mood and Affect: Mood is anxious.        Speech: Speech normal.        Behavior: Behavior is cooperative.        Thought Content: Thought content normal. Thought content is not paranoid or delusional. Thought content does not include homicidal or suicidal ideation. Thought content does not include homicidal or suicidal plan.        Cognition and Memory: Cognition and memory normal.        Judgment: Judgment normal.     Comments: Insight is intact     Lab Review:  No results found for: NA, K, CL, CO2, GLUCOSE, BUN, CREATININE, CALCIUM, PROT, ALBUMIN, AST, ALT, ALKPHOS, BILITOT, GFRNONAA, GFRAA  No results found for: WBC, RBC, HGB, HCT, PLT, MCV, MCH, MCHC, RDW, LYMPHSABS, MONOABS, EOSABS, BASOSABS  No results found for: POCLITH, LITHIUM   No results found for: PHENYTOIN, PHENOBARB, VALPROATE, CBMZ   .res Assessment: Plan:   Discussed potential benefits, risks, and side effects of propanolol.  Discussed that this may be helpful for physical signs and symptoms of anxiety and for social anxiety since she reports that her anxiety typically occurs around anticipated stressors, such as going to work or certain social events.  Discussed that propanolol could be taken about 20 to 30 minutes before event that is likely to trigger her anxiety.  Discussed that based on her description, cleaning and organizing does not seem to be truly obsessive or compulsive at this time since she reports that she has control over this behavior, finds it helpful, and does  not feel compelled to clean.  Advised patient to notify provider if  cleaning became excessive or difficult to manage. Agree with no longer taking Prozac on a daily basis since patient reports having increased somnolence and fatigue when taking Prozac p.o. daily consistently. Recommend continuing all other medications as prescribed. Patient to follow-up in 3 months or sooner if clinically indicated. Patient advised to contact office with any questions, adverse effects, or acute worsening in signs and symptoms.  Jessica Suarez was seen today for anxiety and follow-up.  Diagnoses and all orders for this visit:  Social anxiety disorder -     propranolol (INDERAL) 10 MG tablet; Take 1-2 tabs po BID prn anxiety  Attention deficit disorder (ADD) without hyperactivity Comments: Improved with Ritalin Orders: -     methylphenidate (RITALIN) 20 MG tablet; Take 1/2-1 po BID -     methylphenidate (RITALIN) 20 MG tablet; Take 1/2-1 po BID  Bipolar II disorder (HCC) Comments: Chronic with some mild mood cycling Orders: -     lamoTRIgine (LAMICTAL) 150 MG tablet; Take 1 tablet (150 mg total) by mouth daily. -     QUEtiapine (SEROQUEL) 100 MG tablet; Take 2-3 tabs po QHS  Bipolar II disorder (HCC) -     lamoTRIgine (LAMICTAL) 150 MG tablet; Take 1 tablet (150 mg total) by mouth daily. -     QUEtiapine (SEROQUEL) 100 MG tablet; Take 2-3 tabs po QHS    Please see After Visit Summary for patient specific instructions.  No future appointments.  No orders of the defined types were placed in this encounter.     -------------------------------

## 2019-01-07 ENCOUNTER — Other Ambulatory Visit: Payer: Self-pay | Admitting: Psychiatry

## 2019-01-07 DIAGNOSIS — F3281 Premenstrual dysphoric disorder: Secondary | ICD-10-CM

## 2019-01-16 ENCOUNTER — Other Ambulatory Visit: Payer: Self-pay | Admitting: Psychiatry

## 2019-01-16 DIAGNOSIS — F4010 Social phobia, unspecified: Secondary | ICD-10-CM

## 2019-01-22 ENCOUNTER — Telehealth: Payer: Self-pay | Admitting: Psychiatry

## 2019-01-22 NOTE — Telephone Encounter (Signed)
Patient need refill on [facility]hendate [facility] [address]

## 2019-01-22 NOTE — Telephone Encounter (Signed)
Refills already on file for Sept and Oct

## 2019-01-24 DIAGNOSIS — N978 Female infertility of other origin: Secondary | ICD-10-CM | POA: Diagnosis not present

## 2019-01-25 ENCOUNTER — Other Ambulatory Visit: Payer: Self-pay | Admitting: Psychiatry

## 2019-01-25 DIAGNOSIS — F3181 Bipolar II disorder: Secondary | ICD-10-CM

## 2019-02-11 ENCOUNTER — Other Ambulatory Visit: Payer: Self-pay | Admitting: Psychiatry

## 2019-02-11 DIAGNOSIS — F4010 Social phobia, unspecified: Secondary | ICD-10-CM

## 2019-02-19 DIAGNOSIS — N926 Irregular menstruation, unspecified: Secondary | ICD-10-CM | POA: Diagnosis not present

## 2019-03-06 DIAGNOSIS — Z1231 Encounter for screening mammogram for malignant neoplasm of breast: Secondary | ICD-10-CM | POA: Diagnosis not present

## 2019-03-25 ENCOUNTER — Telehealth: Payer: Self-pay | Admitting: Psychiatry

## 2019-03-25 ENCOUNTER — Other Ambulatory Visit: Payer: Self-pay

## 2019-03-25 ENCOUNTER — Ambulatory Visit: Payer: Managed Care, Other (non HMO) | Admitting: Psychiatry

## 2019-03-25 DIAGNOSIS — F988 Other specified behavioral and emotional disorders with onset usually occurring in childhood and adolescence: Secondary | ICD-10-CM

## 2019-03-25 MED ORDER — METHYLPHENIDATE HCL 20 MG PO TABS: ORAL_TABLET | ORAL | 0 refills | Status: DC

## 2019-03-25 NOTE — Telephone Encounter (Signed)
Pt had to be can'd today with [facility].  Will need RS of Methylphenidate, and Seroqeul RF. To [facility] [address]

## 2019-03-25 NOTE — Telephone Encounter (Signed)
Pharmacy stated she did have one and will get it ready for her.

## 2019-03-25 NOTE — Telephone Encounter (Signed)
Last refill 02/18/2019, pended for approval. Should have refill seroquel on file. Will call to make sure.

## 2019-04-03 ENCOUNTER — Ambulatory Visit: Payer: Managed Care, Other (non HMO) | Admitting: Psychiatry

## 2019-04-03 DIAGNOSIS — Z20828 Contact with and (suspected) exposure to other viral communicable diseases: Secondary | ICD-10-CM | POA: Diagnosis not present

## 2019-04-19 ENCOUNTER — Telehealth: Payer: Self-pay | Admitting: Psychiatry

## 2019-04-19 NOTE — Telephone Encounter (Signed)
Jessica Suarez called to get her appt RS.  Jessica Suarez had to cancel one and Jessica Suarez had to cancel one due to covid.  She is now scheduled for 05/17/19.  She needs a refill of her Ritalin due 04/24/19.  Please send to [place].

## 2019-04-22 ENCOUNTER — Other Ambulatory Visit: Payer: Self-pay

## 2019-04-22 DIAGNOSIS — F988 Other specified behavioral and emotional disorders with onset usually occurring in childhood and adolescence: Secondary | ICD-10-CM

## 2019-04-22 MED ORDER — METHYLPHENIDATE HCL 20 MG PO TABS: ORAL_TABLET | ORAL | 0 refills | Status: DC

## 2019-04-22 NOTE — Telephone Encounter (Signed)
Last refill 11/25 Pended for approva

## 2019-05-17 ENCOUNTER — Ambulatory Visit (INDEPENDENT_AMBULATORY_CARE_PROVIDER_SITE_OTHER): Payer: Managed Care, Other (non HMO) | Admitting: Psychiatry

## 2019-05-17 ENCOUNTER — Encounter: Payer: Self-pay | Admitting: Psychiatry

## 2019-05-17 VITALS — HR 98

## 2019-05-17 DIAGNOSIS — F988 Other specified behavioral and emotional disorders with onset usually occurring in childhood and adolescence: Secondary | ICD-10-CM

## 2019-05-17 DIAGNOSIS — R69 Illness, unspecified: Secondary | ICD-10-CM | POA: Diagnosis not present

## 2019-05-17 DIAGNOSIS — F3181 Bipolar II disorder: Secondary | ICD-10-CM

## 2019-05-17 DIAGNOSIS — F3281 Premenstrual dysphoric disorder: Secondary | ICD-10-CM | POA: Diagnosis not present

## 2019-05-17 DIAGNOSIS — F4010 Social phobia, unspecified: Secondary | ICD-10-CM | POA: Diagnosis not present

## 2019-05-17 MED ORDER — FLUOXETINE HCL 20 MG PO CAPS: ORAL_CAPSULE | ORAL | 0 refills | Status: DC

## 2019-05-17 MED ORDER — LAMOTRIGINE 150 MG PO TABS: 150.0000 mg | ORAL_TABLET | Freq: Every day | ORAL | 0 refills | Status: DC

## 2019-05-17 MED ORDER — METHYLPHENIDATE HCL 20 MG PO TABS: ORAL_TABLET | ORAL | 0 refills | Status: DC

## 2019-05-17 MED ORDER — BUSPIRONE HCL 15 MG PO TABS: 15.0000 mg | ORAL_TABLET | Freq: Two times a day (BID) | ORAL | 1 refills | Status: DC

## 2019-05-17 NOTE — Progress Notes (Signed)
Jessica Suarez [id number] August 09, 1980 39 y.o.  Virtual Visit via Telephone Note  I connected with pt on 05/17/19 at  9:30 AM EST by telephone and verified that I am speaking with the correct person using two identifiers.   I discussed the limitations, risks, security and privacy concerns of performing an evaluation and management service by telephone and the availability of in person appointments. I also discussed with the patient that there may be a patient responsible charge related to this service. The patient expressed understanding and agreed to proceed.   I discussed the assessment and treatment plan with the patient. The patient was provided an opportunity to ask questions and all were answered. The patient agreed with the plan and demonstrated an understanding of the instructions.   The patient was advised to call back or seek an in-person evaluation if the symptoms worsen or if the condition fails to improve as anticipated.  I provided 30 minutes of non-face-to-face time during this encounter.  The patient was located at home.  The provider was located at [facility].   Jessica Suarez, PMHNP   Subjective:   Patient ID:  Jessica Suarez is a 39 y.o. (DOB 08-Aug-1980) female.  Chief Complaint:  Chief Complaint  Patient presents with  . Anxiety  . Follow-up    h/o mood disturbance, insomnia    HPI Jessica Suarez presents for follow-up of mood d/o, anxiety, and ADD. 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 She reports that she has anxiety when husband drinks since he has pattern of wanting to initiate a serious conversation or conflict. Reports that she continues to have anxiety in social situations, such as when she is around her in-laws, and this has caused some tension in her relationship with her husband. She reports that she is often afraid that she will say the wrong things, laugh at the wrong time, or have something wrong with her appearance.  She reports that she had one episode of acute anxiety with palpitations. Reports that her mood has been primarily anxious and irritable. She report that she has been sleeping excessively on her days off. Reports that she has been trying to exercise more. Appetite has been increased. Reports some recent wt gain. Reports concentration is consistent with baseline. Denies SI.   Reports continued work stress and took Monday off since she needed to take a break.   Reports that she started taking Prozac this week for PMDD s/s. Typically will take Propranolol prior to difficult conversations and work meetings. She reports that Propranolol prn is helpful for anticipatory anxiety and that she then feels more confidant in these situations.   Past Medication Trials: Latuda-"did not like how it made me feel" Seroquel Wellbutrin-ineffective Zoloft Effexor Prozac-helpful for PMDD signs and symptoms Lexapro Provigil-not as helpful for daytime somnolence Methylphenidate BuSpar-helpful for anxiety Lamictal-helpful for mood Propranolol  Review of Systems:  Review of Systems  Cardiovascular: Negative for palpitations.  Genitourinary: Positive for menstrual problem.  Musculoskeletal: Negative for gait problem.  Neurological: Negative for tremors.  Psychiatric/Behavioral:       Please refer to HPI    Medications: I have reviewed the patient's current medications.  Current Outpatient Medications  Medication Sig Dispense Refill  . APRI 0.15-30 MG-MCG tablet TAKE 1 TABLET EVERY DAY CONTINUOUSLY FOR 3 MONTHS  1  . FLUoxetine (PROZAC) 20 MG capsule TAKE 1 CAPSULE BY MOUTH DAILY 1-2 WEEKS PRIOR TO MENSES 90 capsule 0  . lamoTRIgine (LAMICTAL) 150 MG tablet  Take 1 tablet (150 mg total) by mouth daily. 90 tablet 0  . levocetirizine (XYZAL) 5 MG tablet Take 5 mg by mouth every evening.    Derrill Memo ON 06/22/2019] methylphenidate (RITALIN) 20 MG tablet Take 1/2-1 po BID 60 tablet 0  . [START ON 05/25/2019]  methylphenidate (RITALIN) 20 MG tablet Take 1/2-1 po BID 60 tablet 0  . propranolol (INDERAL) 10 MG tablet TAKE 1 TO 2 TABLETS BY MOUTH TWICE A DAY AS NEEDED FOR ANXIETY 360 tablet 1  . QUEtiapine (SEROQUEL) 100 MG tablet Take 2-3 tabs po QHS 270 tablet 1  . busPIRone (BUSPAR) 15 MG tablet Take 1 tablet (15 mg total) by mouth 2 (two) times daily. 180 tablet 1  . clomiPHENE (CLOMID) 50 MG tablet clomiphene citrate 50 mg tablet  Take 2 tablets every day by oral route for 5 days.    Marland Kitchen EPINEPHrine (EPIPEN 2-PAK) 0.3 mg/0.3 mL IJ SOAJ injection Inject into the muscle as needed (INJECTION PROTOCOL).    Marland Kitchen ibuprofen (ADVIL,MOTRIN) 600 MG tablet TAKE 1 TABLET EVERY 6 TO 8 HOURS AS NEEDED FOR PAIN  0  . [START ON 07/20/2019] methylphenidate (RITALIN) 20 MG tablet Take 1/2-1 po BID 60 tablet 0  . oxybutynin (DITROPAN) 5 MG tablet     . Oxybutynin (OXYTROL TD) oxybutynin  5 mg     No current facility-administered medications for this visit.    Medication Side Effects: None  Allergies: No Known Allergies  Past Medical History:  Diagnosis Date  . Allergic rhinitis   . Depression   . History of Hodgkin's lymphoma MAY 2004  . IBS (irritable bowel syndrome)   . Migraines   . Sleep disorder     Family History  Problem Relation Age of Onset  . Hypertension Mother   . Hypercholesterolemia Mother   . Asthma Father   . Diabetes Father   . Asthma Brother   . Bipolar disorder Brother   . Suicidality Brother   . Drug abuse Paternal Aunt   . Aneurysm Paternal Aunt   . Anxiety disorder Other   . Cancer Paternal Aunt     Social History   Socioeconomic History  . Marital status: Single    Spouse name: Not on file  . Number of children: Not on file  . Years of education: Not on file  . Highest education level: Not on file  Occupational History  . Not on file  Tobacco Use  . Smoking status: Passive Smoke Exposure - Never Smoker  . Smokeless tobacco: Never Used  Substance and Sexual Activity   . Alcohol use: Yes    Alcohol/week: 2.0 standard drinks    Types: 2 Shots of liquor per week  . Drug use: No  . Sexual activity: Yes    Birth control/protection: None  Other Topics Concern  . Not on file  Social History Narrative  . Not on file   Social Determinants of Health   Financial Resource Strain:   . Difficulty of Paying Living Expenses: Not on file  Food Insecurity:   . Worried About Charity fundraiser in the Last Year: Not on file  . Ran Out of Food in the Last Year: Not on file  Transportation Needs:   . Lack of Transportation (Medical): Not on file  . Lack of Transportation (Non-Medical): Not on file  Physical Activity:   . Days of Exercise per Week: Not on file  . Minutes of Exercise per Session: Not on file  Stress:   .  Feeling of Stress : Not on file  Social Connections:   . Frequency of Communication with Friends and Family: Not on file  . Frequency of Social Gatherings with Friends and Family: Not on file  . Attends Religious Services: Not on file  . Active Member of Clubs or Organizations: Not on file  . Attends Banker Meetings: Not on file  . Marital Status: Not on file  Intimate Partner Violence:   . Fear of Current or Ex-Partner: Not on file  . Emotionally Abused: Not on file  . Physically Abused: Not on file  . Sexually Abused: Not on file    Past Medical History, Surgical history, Social history, and Family history were reviewed and updated as appropriate.   Please see review of systems for further details on the patient's review from today.   Objective:   Physical Exam:  Pulse 98   Physical Exam Neurological:     Mental Status: She is alert and oriented to person, place, and time.     Cranial Nerves: No dysarthria.  Psychiatric:        Attention and Perception: Attention and perception normal.        Mood and Affect: Mood is anxious.        Speech: Speech normal.        Behavior: Behavior is cooperative.        Thought  Content: Thought content normal. Thought content is not paranoid or delusional. Thought content does not include homicidal or suicidal ideation. Thought content does not include homicidal or suicidal plan.        Cognition and Memory: Cognition and memory normal.        Judgment: Judgment normal.     Comments: Insight intact     Lab Review:  No results found for: NA, K, CL, CO2, GLUCOSE, BUN, CREATININE, CALCIUM, PROT, ALBUMIN, AST, ALT, ALKPHOS, BILITOT, GFRNONAA, GFRAA  No results found for: WBC, RBC, HGB, HCT, PLT, MCV, MCH, MCHC, RDW, LYMPHSABS, MONOABS, EOSABS, BASOSABS  No results found for: POCLITH, LITHIUM   No results found for: PHENYTOIN, PHENOBARB, VALPROATE, CBMZ   .res Assessment: Plan:   Patient seen for 30 minutes and time spent counseling patient regarding potential benefits of therapy, since patient reports that her anxiety symptoms are primarily in response to acute psychosocial stressors.  Patient reports that it is difficult for her to feel comfortable with someone new and will need to consider this further. She reports that she would prefer to continue medications without changes since she reports that changes in medications would likely not be effective and that s/s are primarily circumstantial. Will continue current plan of care at this time.  Discussed continuing Prozac around time of menses for PMDD. Pt to f/u in 4 weeks or sooner if clinically indicated.  Patient advised to contact office with any questions, adverse effects, or acute worsening in signs and symptoms.  Jessica Suarez was seen today for anxiety and follow-up.  Diagnoses and all orders for this visit:  Social anxiety disorder Comments: Chronic Orders: -     busPIRone (BUSPAR) 15 MG tablet; Take 1 tablet (15 mg total) by mouth 2 (two) times daily.  Bipolar II disorder (HCC) -     lamoTRIgine (LAMICTAL) 150 MG tablet; Take 1 tablet (150 mg total) by mouth daily.  Attention deficit disorder (ADD)  without hyperactivity Comments: Improved with Ritalin Orders: -     methylphenidate (RITALIN) 20 MG tablet; Take 1/2-1 po BID -  methylphenidate (RITALIN) 20 MG tablet; Take 1/2-1 po BID -     methylphenidate (RITALIN) 20 MG tablet; Take 1/2-1 po BID  PMDD (premenstrual dysphoric disorder) Comments: Chronic Orders: -     FLUoxetine (PROZAC) 20 MG capsule; TAKE 1 CAPSULE BY MOUTH DAILY 1-2 WEEKS PRIOR TO MENSES    Please see After Visit Summary for patient specific instructions.  No future appointments.  No orders of the defined types were placed in this encounter.     -------------------------------

## 2019-06-19 ENCOUNTER — Other Ambulatory Visit: Payer: Self-pay | Admitting: Psychiatry

## 2019-06-19 DIAGNOSIS — F3181 Bipolar II disorder: Secondary | ICD-10-CM

## 2019-07-24 ENCOUNTER — Telehealth: Payer: Self-pay | Admitting: Psychiatry

## 2019-07-24 NOTE — Telephone Encounter (Signed)
Patient already has Ritalin 20 mg on file at [facility] and can fill on 07/26/2019

## 2019-07-24 NOTE — Telephone Encounter (Signed)
Pt would like a refill on her methylphenidate 20mg . [facility] to [facility] [address].

## 2019-08-08 ENCOUNTER — Ambulatory Visit (INDEPENDENT_AMBULATORY_CARE_PROVIDER_SITE_OTHER): Payer: 59 | Admitting: Psychiatry

## 2019-08-08 ENCOUNTER — Encounter: Payer: Self-pay | Admitting: Psychiatry

## 2019-08-08 VITALS — HR 85

## 2019-08-08 DIAGNOSIS — F988 Other specified behavioral and emotional disorders with onset usually occurring in childhood and adolescence: Secondary | ICD-10-CM | POA: Diagnosis not present

## 2019-08-08 DIAGNOSIS — F3281 Premenstrual dysphoric disorder: Secondary | ICD-10-CM

## 2019-08-08 DIAGNOSIS — F3181 Bipolar II disorder: Secondary | ICD-10-CM

## 2019-08-08 DIAGNOSIS — F4010 Social phobia, unspecified: Secondary | ICD-10-CM | POA: Diagnosis not present

## 2019-08-08 DIAGNOSIS — R69 Illness, unspecified: Secondary | ICD-10-CM | POA: Diagnosis not present

## 2019-08-08 MED ORDER — METHYLPHENIDATE HCL 20 MG PO TABS: ORAL_TABLET | ORAL | 0 refills | Status: DC

## 2019-08-08 MED ORDER — LAMOTRIGINE 150 MG PO TABS: 150.0000 mg | ORAL_TABLET | Freq: Every day | ORAL | 0 refills | Status: DC

## 2019-08-08 MED ORDER — BUSPIRONE HCL 15 MG PO TABS: 15.0000 mg | ORAL_TABLET | Freq: Two times a day (BID) | ORAL | 1 refills | Status: DC

## 2019-08-08 NOTE — Progress Notes (Signed)
Jessica Suarez [id number] 1980-05-24 39 y.o.  Virtual Visit via Telephone Note  I connected with pt on 08/08/19 at  9:30 AM EDT by telephone and verified that I am speaking with the correct person using two identifiers.   I discussed the limitations, risks, security and privacy concerns of performing an evaluation and management service by telephone and the availability of in person appointments. I also discussed with the patient that there may be a patient responsible charge related to this service. The patient expressed understanding and agreed to proceed.   I discussed the assessment and treatment plan with the patient. The patient was provided an opportunity to ask questions and all were answered. The patient agreed with the plan and demonstrated an understanding of the instructions.   The patient was advised to call back or seek an in-person evaluation if the symptoms worsen or if the condition fails to improve as anticipated.  I provided 30 minutes of non-face-to-face time during this encounter.  The patient was located at home.  The provider was located at [facility].   Corie Chiquito, PMHNP   Subjective:   Patient ID:  Jessica Suarez is a 39 y.o. (DOB November 01, 1980) female.  Chief Complaint:  Chief Complaint  Patient presents with  . Follow-up    h/o mood disturbance, anxiety, ADD    HPI Jessica Suarez presents for follow-up of mood, anxiety, ADD, and sleep disturbance. She reports that her work has been busy recently. She reports that she has been having heavy bleeding and mood s/s around her menstrual cycles. She reports irritability around menses. She reports that in the past she took OCP's to help with heavy bleeding and PMDD s/s. She reports that her periods have been more regular and in the past her periods were less predictable and she has therefore not been taking Prozac.   She reports that her mood has been "not bad" aside from the time around her period.  She reports that she and her husband have talked about her trying to improve her health overall with trying to possibly conceive. She reports that her mood and anxiety have improved since they had this conversation and feels more supported and understands her husband's perspective. Reports that she has been feeling more hopeful. Has been trying to increase physical activity. Reports that at the end of her work day she is exhausted. Has been trying to decrease ETOH intake. Describes herself as an Product manager." She reports occ binge eating. She reports that she usually will not eat before going to work, will eat something when she feels hungry at work, and eating almost 2 dinners after work. Denies recent excessive spending. She reports that her anxiety has improved significantly. She reports some anxiety with social interactions at work and will take Propranolol prn periodically and reports that this has been helpful. She reports occ sleep disturbance at times when she is taking Methylphenidate later. Motivation has been ok. Concentration has been ok. Denies any recent manic s/s. Denies SI.   Past Medication Trials: Latuda-"did not like how it made me feel" Seroquel Wellbutrin-ineffective Zoloft Effexor Prozac-helpful for PMDD signs and symptoms Lexapro Provigil-not as helpful for daytime somnolence Methylphenidate BuSpar-helpful for anxiety Lamictal-helpful for mood Propranolol  Review of Systems:  Review of Systems  Cardiovascular: Negative for palpitations.  Genitourinary:       Heavy menstrual bleeding  Musculoskeletal: Negative for gait problem.  Neurological: Negative for tremors.  Psychiatric/Behavioral:       Please refer  to HPI   Has had first [place] vaccination.  Medications: I have reviewed the patient's current medications.  Current Outpatient Medications  Medication Sig Dispense Refill  . busPIRone (BUSPAR) 15 MG tablet Take 1 tablet (15 mg total) by mouth 2 (two)  times daily. 180 tablet 1  . lamoTRIgine (LAMICTAL) 150 MG tablet Take 1 tablet (150 mg total) by mouth daily. 90 tablet 0  . levocetirizine (XYZAL) 5 MG tablet Take 5 mg by mouth every evening.    . propranolol (INDERAL) 10 MG tablet TAKE 1 TO 2 TABLETS BY MOUTH TWICE A DAY AS NEEDED FOR ANXIETY 360 tablet 1  . QUEtiapine (SEROQUEL) 100 MG tablet TAKE 2-3 TABLETS BY MOUTH AT BEDTIME 270 tablet 1  . EPINEPHrine (EPIPEN 2-PAK) 0.3 mg/0.3 mL IJ SOAJ injection Inject into the muscle as needed (INJECTION PROTOCOL).    Marland Kitchen FLUoxetine (PROZAC) 20 MG capsule TAKE 1 CAPSULE BY MOUTH DAILY 1-2 WEEKS PRIOR TO MENSES (Patient not taking: Reported on 08/08/2019) 90 capsule 0  . ibuprofen (ADVIL,MOTRIN) 600 MG tablet TAKE 1 TABLET EVERY 6 TO 8 HOURS AS NEEDED FOR PAIN  0  . [START ON 10/18/2019] methylphenidate (RITALIN) 20 MG tablet Take 1/2-1 po BID 60 tablet 0  . [START ON 09/20/2019] methylphenidate (RITALIN) 20 MG tablet Take 1/2-1 po BID 60 tablet 0  . [START ON 08/23/2019] methylphenidate (RITALIN) 20 MG tablet Take 1/2-1 po BID 60 tablet 0   No current facility-administered medications for this visit.    Medication Side Effects: None  Allergies: No Known Allergies  Past Medical History:  Diagnosis Date  . Allergic rhinitis   . Depression   . History of Hodgkin's lymphoma MAY 2004  . IBS (irritable bowel syndrome)   . Migraines   . Sleep disorder     Family History  Problem Relation Age of Onset  . Hypertension Mother   . Hypercholesterolemia Mother   . Asthma Father   . Diabetes Father   . Asthma Brother   . Bipolar disorder Brother   . Suicidality Brother   . Drug abuse Paternal Aunt   . Aneurysm Paternal Aunt   . Anxiety disorder Other   . Cancer Paternal Aunt     Social History   Socioeconomic History  . Marital status: Single    Spouse name: Not on file  . Number of children: Not on file  . Years of education: Not on file  . Highest education level: Not on file   Occupational History  . Not on file  Tobacco Use  . Smoking status: Passive Smoke Exposure - Never Smoker  . Smokeless tobacco: Never Used  Substance and Sexual Activity  . Alcohol use: Yes    Alcohol/week: 2.0 standard drinks    Types: 2 Shots of liquor per week  . Drug use: No  . Sexual activity: Yes    Birth control/protection: None  Other Topics Concern  . Not on file  Social History Narrative  . Not on file   Social Determinants of Health   Financial Resource Strain:   . Difficulty of Paying Living Expenses:   Food Insecurity:   . Worried About Charity fundraiser in the Last Year:   . Arboriculturist in the Last Year:   Transportation Needs:   . Film/video editor (Medical):   Marland Kitchen Lack of Transportation (Non-Medical):   Physical Activity:   . Days of Exercise per Week:   . Minutes of Exercise per Session:  Stress:   . Feeling of Stress :   Social Connections:   . Frequency of Communication with Friends and Family:   . Frequency of Social Gatherings with Friends and Family:   . Attends Religious Services:   . Active Member of Clubs or Organizations:   . Attends Banker Meetings:   Marland Kitchen Marital Status:   Intimate Partner Violence:   . Fear of Current or Ex-Partner:   . Emotionally Abused:   Marland Kitchen Physically Abused:   . Sexually Abused:     Past Medical History, Surgical history, Social history, and Family history were reviewed and updated as appropriate.   Please see review of systems for further details on the patient's review from today.   Objective:   Physical Exam:  Pulse 85   Physical Exam Neurological:     Mental Status: She is alert and oriented to person, place, and time.     Cranial Nerves: No dysarthria.  Psychiatric:        Attention and Perception: Attention and perception normal.        Speech: Speech normal.        Behavior: Behavior is cooperative.        Thought Content: Thought content normal. Thought content is not  paranoid or delusional. Thought content does not include homicidal or suicidal ideation. Thought content does not include homicidal or suicidal plan.        Cognition and Memory: Cognition and memory normal.        Judgment: Judgment normal.     Comments: Insight intact Mood is appropriate to content     Lab Review:  No results found for: NA, K, CL, CO2, GLUCOSE, BUN, CREATININE, CALCIUM, PROT, ALBUMIN, AST, ALT, ALKPHOS, BILITOT, GFRNONAA, GFRAA  No results found for: WBC, RBC, HGB, HCT, PLT, MCV, MCH, MCHC, RDW, LYMPHSABS, MONOABS, EOSABS, BASOSABS  No results found for: POCLITH, LITHIUM   No results found for: PHENYTOIN, PHENOBARB, VALPROATE, CBMZ   .res Assessment: Plan:   Discussed tracking menstrual cycle and associated s/s. Discussed resuming Prozac prior to onset of menses since her menstrual cycle has been more regular and predictable, and since she is noticing PMDD s/s. Will continue all other medications as prescribed.  Pt to f/u in 3 months or sooner if clinically indicated.  Patient advised to contact office with any questions, adverse effects, or acute worsening in signs and symptoms.  Jessica Suarez was seen today for follow-up.  Diagnoses and all orders for this visit:  PMDD (premenstrual dysphoric disorder)  Attention deficit disorder (ADD) without hyperactivity -     methylphenidate (RITALIN) 20 MG tablet; Take 1/2-1 po BID -     methylphenidate (RITALIN) 20 MG tablet; Take 1/2-1 po BID -     methylphenidate (RITALIN) 20 MG tablet; Take 1/2-1 po BID  Social anxiety disorder -     busPIRone (BUSPAR) 15 MG tablet; Take 1 tablet (15 mg total) by mouth 2 (two) times daily.  Bipolar II disorder (HCC) -     lamoTRIgine (LAMICTAL) 150 MG tablet; Take 1 tablet (150 mg total) by mouth daily.    Please see After Visit Summary for patient specific instructions.  No future appointments.  No orders of the defined types were placed in this encounter.      -------------------------------

## 2019-08-20 ENCOUNTER — Other Ambulatory Visit: Payer: Self-pay | Admitting: Psychiatry

## 2019-08-20 DIAGNOSIS — F4010 Social phobia, unspecified: Secondary | ICD-10-CM

## 2019-11-25 ENCOUNTER — Other Ambulatory Visit: Payer: Self-pay

## 2019-11-25 ENCOUNTER — Telehealth: Payer: Self-pay | Admitting: Psychiatry

## 2019-11-25 DIAGNOSIS — F988 Other specified behavioral and emotional disorders with onset usually occurring in childhood and adolescence: Secondary | ICD-10-CM

## 2019-11-25 MED ORDER — METHYLPHENIDATE HCL 20 MG PO TABS: ORAL_TABLET | ORAL | 0 refills | Status: DC

## 2019-11-25 NOTE — Telephone Encounter (Signed)
Patient called and said that she needs a refill on her ritalin 10 mg [facility] [address] Pt has an appt on 8/10

## 2019-11-25 NOTE — Telephone Encounter (Signed)
Last refill 10/28/2019 Pended for Shanda Bumps to submit Apt 12/10/19

## 2019-11-27 ENCOUNTER — Other Ambulatory Visit: Payer: Self-pay

## 2019-11-27 ENCOUNTER — Telehealth: Payer: Self-pay | Admitting: Psychiatry

## 2019-11-27 DIAGNOSIS — F988 Other specified behavioral and emotional disorders with onset usually occurring in childhood and adolescence: Secondary | ICD-10-CM

## 2019-11-27 MED ORDER — METHYLPHENIDATE HCL 20 MG PO TABS: ORAL_TABLET | ORAL | 0 refills | Status: DC

## 2019-11-27 NOTE — Telephone Encounter (Signed)
Pt wants to know if her Ritalin can be called in at [facility] on [address]. [facility] is [facility] completely.

## 2019-12-10 ENCOUNTER — Other Ambulatory Visit: Payer: Self-pay

## 2019-12-10 ENCOUNTER — Encounter: Payer: Self-pay | Admitting: Psychiatry

## 2019-12-10 ENCOUNTER — Ambulatory Visit (INDEPENDENT_AMBULATORY_CARE_PROVIDER_SITE_OTHER): Payer: 59 | Admitting: Psychiatry

## 2019-12-10 DIAGNOSIS — F4010 Social phobia, unspecified: Secondary | ICD-10-CM | POA: Diagnosis not present

## 2019-12-10 DIAGNOSIS — F3181 Bipolar II disorder: Secondary | ICD-10-CM | POA: Diagnosis not present

## 2019-12-10 DIAGNOSIS — F988 Other specified behavioral and emotional disorders with onset usually occurring in childhood and adolescence: Secondary | ICD-10-CM | POA: Diagnosis not present

## 2019-12-10 DIAGNOSIS — R69 Illness, unspecified: Secondary | ICD-10-CM | POA: Diagnosis not present

## 2019-12-10 MED ORDER — QUETIAPINE FUMARATE 100 MG PO TABS: ORAL_TABLET | ORAL | 1 refills | Status: DC

## 2019-12-10 MED ORDER — LAMOTRIGINE 150 MG PO TABS: 150.0000 mg | ORAL_TABLET | Freq: Every day | ORAL | 0 refills | Status: DC

## 2019-12-10 MED ORDER — METHYLPHENIDATE HCL 20 MG PO TABS: ORAL_TABLET | ORAL | 0 refills | Status: DC

## 2019-12-10 MED ORDER — GUANFACINE HCL 1 MG PO TABS: ORAL_TABLET | ORAL | 2 refills | Status: DC

## 2019-12-10 MED ORDER — BUSPIRONE HCL 15 MG PO TABS: 15.0000 mg | ORAL_TABLET | Freq: Two times a day (BID) | ORAL | 1 refills | Status: DC

## 2019-12-10 NOTE — Progress Notes (Signed)
Jessica Suarez [id number] January 18, 1981 39 y.o.  Subjective:   Patient ID:  Jessica Suarez is a 39 y.o. (DOB 14-Aug-1980) female.  Chief Complaint:  Chief Complaint  Patient presents with  . Follow-up    Anxiety, mood disturbance, ADD, insomnia    HPI Jessica Suarez presents to the office today for follow-up of anxiety, mood disturbance, insomnia, and ADD. She reports that "things have been up and down." She reports that they have been busier on the weekends than usual. Reports that she has anxiety with being around husband's friends due to uncertainty about husband's expectations. She reports energy and motivation have been ok. She reports periods of hyper-focus, such as thinking about she needs to do at work when she is about to go into work. She reports that she tends to focus on tasks that she finds interesting and enjoyable and want to avoid tasks that are more tedious or less interesting to her. She reports some difficulty when her day does not go as planned. Denies recent depressed mood. She reports that she could sleep excessively on the weekends. Reports that she has been sleeping less during the week due to having to make work calls. Appetite has been ok. Reports appetite is decreased at the start of her day and then feels hungry once she gets to work. Denies SI.  She reports taking Propranolol prn only prior to difficult conversations. Typically taking only Seroquel 100 mg po QHS since she would sleep excessively   She reports that she is concerned about husband's ETOH use.   Was able to see good friend for the first time in over a year.   Past Medication Trials: Latuda-"did not like how it made me feel" Seroquel Wellbutrin-ineffective Zoloft Effexor Prozac-helpful for PMDD signs and symptoms Lexapro Provigil-not as helpful for daytime somnolence Methylphenidate BuSpar-helpful for anxiety Lamictal-helpful for mood Propranolol AIMS     Office Visit from 12/10/2019 in  [facility]  AIMS Total Score 0       Review of Systems:  Review of Systems  Genitourinary:       Reports that she has had irregular periods. Will experience some PMS/PMDD s/s without bleeding.  Musculoskeletal: Negative for gait problem.  Neurological: Negative for tremors.  Psychiatric/Behavioral:       Please refer to HPI    Medications: I have reviewed the patient's current medications.  Current Outpatient Medications  Medication Sig Dispense Refill  . APPLE CIDER VINEGAR PO Take by mouth.    . lamoTRIgine (LAMICTAL) 150 MG tablet Take 1 tablet (150 mg total) by mouth daily. 90 tablet 0  . levocetirizine (XYZAL) 5 MG tablet Take 5 mg by mouth every evening.    [name] 02/19/2020] methylphenidate (RITALIN) 20 MG tablet Take 1/2-1 po BID 60 tablet 0  . [START ON 01/22/2020] methylphenidate (RITALIN) 20 MG tablet Take 1/2-1 po BID 60 tablet 0  . [START ON 12/25/2019] methylphenidate (RITALIN) 20 MG tablet Take 1/2-1 po BID 60 tablet 0  . propranolol (INDERAL) 10 MG tablet TAKE 1 TO 2 TABLETS BY MOUTH TWICE A DAY AS NEEDED FOR ANXIETY 360 tablet 1  . QUEtiapine (SEROQUEL) 100 MG tablet TAKE 2-3 TABLETS BY MOUTH AT BEDTIME 270 tablet 1  . busPIRone (BUSPAR) 15 MG tablet Take 1 tablet (15 mg total) by mouth 2 (two) times daily. 180 tablet 1  . EPINEPHrine (EPIPEN 2-PAK) 0.3 mg/0.3 mL IJ SOAJ injection Inject into the muscle as needed (INJECTION PROTOCOL).    Marland Kitchen  FLUoxetine (PROZAC) 20 MG capsule TAKE 1 CAPSULE BY MOUTH DAILY 1-2 WEEKS PRIOR TO MENSES (Patient not taking: Reported on 08/08/2019) 90 capsule 0  . guanFACINE (TENEX) 1 MG tablet Take 1 tab po QHS x 4 days, then may increase to 1 tab po BID 60 tablet 2  . ibuprofen (ADVIL,MOTRIN) 600 MG tablet TAKE 1 TABLET EVERY 6 TO 8 HOURS AS NEEDED FOR PAIN  0   No current facility-administered medications for this visit.    Medication Side Effects: None  Allergies: No Known Allergies  Past Medical History:   Diagnosis Date  . Allergic rhinitis   . Depression   . History of Hodgkin's lymphoma MAY 2004  . IBS (irritable bowel syndrome)   . Migraines   . Sleep disorder     Family History  Problem Relation Age of Onset  . Hypertension Mother   . Hypercholesterolemia Mother   . Asthma Father   . Diabetes Father   . Asthma Brother   . Bipolar disorder Brother   . Suicidality Brother   . Drug abuse Paternal Aunt   . Aneurysm Paternal Aunt   . Anxiety disorder Other   . Cancer Paternal Aunt     Social History   Socioeconomic History  . Marital status: Single    Spouse name: Not on file  . Number of children: Not on file  . Years of education: Not on file  . Highest education level: Not on file  Occupational History  . Not on file  Tobacco Use  . Smoking status: Passive Smoke Exposure - Never Smoker  . Smokeless tobacco: Never Used  Substance and Sexual Activity  . Alcohol use: Yes    Alcohol/week: 2.0 standard drinks    Types: 2 Shots of liquor per week  . Drug use: No  . Sexual activity: Yes    Birth control/protection: None  Other Topics Concern  . Not on file  Social History Narrative  . Not on file   Social Determinants of Health   Financial Resource Strain:   . Difficulty of Paying Living Expenses:   Food Insecurity:   . Worried About Programme researcher, broadcasting/film/video in the Last Year:   . Barista in the Last Year:   Transportation Needs:   . Freight forwarder (Medical):   Marland Kitchen Lack of Transportation (Non-Medical):   Physical Activity:   . Days of Exercise per Week:   . Minutes of Exercise per Session:   Stress:   . Feeling of Stress :   Social Connections:   . Frequency of Communication with Friends and Family:   . Frequency of Social Gatherings with Friends and Family:   . Attends Religious Services:   . Active Member of Clubs or Organizations:   . Attends Banker Meetings:   Marland Kitchen Marital Status:   Intimate Partner Violence:   . Fear of  Current or Ex-Partner:   . Emotionally Abused:   Marland Kitchen Physically Abused:   . Sexually Abused:     Past Medical History, Surgical history, Social history, and Family history were reviewed and updated as appropriate.   Please see review of systems for further details on the patient's review from today.   Objective:   Physical Exam:  BP (!) 148/93   Pulse 90   Wt 155 lb (70.3 kg)   Physical Exam Constitutional:      General: She is not in acute distress. Musculoskeletal:  General: No deformity.  Neurological:     Mental Status: She is alert and oriented to person, place, and time.     Coordination: Coordination normal.  Psychiatric:        Attention and Perception: Attention and perception normal. She does not perceive auditory or visual hallucinations.        Mood and Affect: Mood normal. Mood is not anxious or depressed. Affect is not labile, blunt, angry or inappropriate.        Speech: Speech normal.        Behavior: Behavior normal.        Thought Content: Thought content normal. Thought content is not paranoid or delusional. Thought content does not include homicidal or suicidal ideation. Thought content does not include homicidal or suicidal plan.        Cognition and Memory: Cognition and memory normal.        Judgment: Judgment normal.     Comments: Insight intact     Lab Review:  No results found for: NA, K, CL, CO2, GLUCOSE, BUN, CREATININE, CALCIUM, PROT, ALBUMIN, AST, ALT, ALKPHOS, BILITOT, GFRNONAA, GFRAA  No results found for: WBC, RBC, HGB, HCT, PLT, MCV, MCH, MCHC, RDW, LYMPHSABS, MONOABS, EOSABS, BASOSABS  No results found for: POCLITH, LITHIUM   No results found for: PHENYTOIN, PHENOBARB, VALPROATE, CBMZ   .res Assessment: Plan:   Patient asks about guanfacine for attention deficit disorder.  Discussed potential benefits, risks, and side effects of guanfacine.  Will start trial of guanfacine 1 mg at bedtime for 4 days, then increase to 1 mg twice  daily for attention deficit disorder.  Discussed that further titration of dosage may be required to fully improve concentration and focus. Will continue Ritalin 20 mg 1/2-1 tab twice daily for concentration. Continue BuSpar 15 mg twice daily for anxiety. Continue Seroquel 100 mg 1-3 tabs p.o. nightly for mood signs and symptoms and insomnia. Continue lamotrigine 150 mg daily for mood stabilization. Patient follow-up in 3 months or sooner if clinically indicated. Patient advised to contact office with any questions, adverse effects, or acute worsening in signs and symptoms.  Jessica Suarez was seen today for follow-up.  Diagnoses and all orders for this visit:  Attention deficit disorder (ADD) without hyperactivity -     guanFACINE (TENEX) 1 MG tablet; Take 1 tab po QHS x 4 days, then may increase to 1 tab po BID -     methylphenidate (RITALIN) 20 MG tablet; Take 1/2-1 po BID -     methylphenidate (RITALIN) 20 MG tablet; Take 1/2-1 po BID -     methylphenidate (RITALIN) 20 MG tablet; Take 1/2-1 po BID  Social anxiety disorder -     busPIRone (BUSPAR) 15 MG tablet; Take 1 tablet (15 mg total) by mouth 2 (two) times daily.  Bipolar II disorder (HCC) -     lamoTRIgine (LAMICTAL) 150 MG tablet; Take 1 tablet (150 mg total) by mouth daily. -     QUEtiapine (SEROQUEL) 100 MG tablet; TAKE 2-3 TABLETS BY MOUTH AT BEDTIME     Please see After Visit Summary for patient specific instructions.  Future Appointments  Date Time Provider Department Center  03/11/2020  9:00 AM Corie Chiquito, PMHNP [facility] None    No orders of the defined types were placed in this encounter.   -------------------------------

## 2019-12-10 NOTE — Progress Notes (Signed)
   12/10/19 1040  Facial and Oral Movements  Muscles of Facial Expression 0  Lips and Perioral Area 0  Jaw 0  Tongue 0  Extremity Movements  Upper (arms, wrists, hands, fingers) 0  Lower (legs, knees, ankles, toes) 0  Trunk Movements  Neck, shoulders, hips 0  Overall Severity  Severity of abnormal movements (highest score from questions above) 0  Incapacitation due to abnormal movements 0  Patient's awareness of abnormal movements (rate only patient's report) 0  AIMS Total Score  AIMS Total Score 0

## 2020-03-02 ENCOUNTER — Other Ambulatory Visit: Payer: Self-pay | Admitting: Psychiatry

## 2020-03-02 DIAGNOSIS — F988 Other specified behavioral and emotional disorders with onset usually occurring in childhood and adolescence: Secondary | ICD-10-CM

## 2020-03-11 ENCOUNTER — Ambulatory Visit (INDEPENDENT_AMBULATORY_CARE_PROVIDER_SITE_OTHER): Payer: 59 | Admitting: Psychiatry

## 2020-03-11 ENCOUNTER — Encounter: Payer: Self-pay | Admitting: Psychiatry

## 2020-03-11 ENCOUNTER — Other Ambulatory Visit: Payer: Self-pay

## 2020-03-11 ENCOUNTER — Ambulatory Visit: Payer: 59 | Admitting: Psychiatry

## 2020-03-11 DIAGNOSIS — F4010 Social phobia, unspecified: Secondary | ICD-10-CM | POA: Diagnosis not present

## 2020-03-11 DIAGNOSIS — R69 Illness, unspecified: Secondary | ICD-10-CM | POA: Diagnosis not present

## 2020-03-11 DIAGNOSIS — F988 Other specified behavioral and emotional disorders with onset usually occurring in childhood and adolescence: Secondary | ICD-10-CM | POA: Diagnosis not present

## 2020-03-11 DIAGNOSIS — F3181 Bipolar II disorder: Secondary | ICD-10-CM | POA: Diagnosis not present

## 2020-03-11 MED ORDER — BUSPIRONE HCL 15 MG PO TABS: 15.0000 mg | ORAL_TABLET | Freq: Two times a day (BID) | ORAL | 1 refills | Status: DC

## 2020-03-11 MED ORDER — PROPRANOLOL HCL 10 MG PO TABS: ORAL_TABLET | ORAL | 1 refills | Status: DC

## 2020-03-11 MED ORDER — METHYLPHENIDATE HCL 20 MG PO TABS: ORAL_TABLET | ORAL | 0 refills | Status: DC

## 2020-03-11 MED ORDER — QUETIAPINE FUMARATE 100 MG PO TABS: ORAL_TABLET | ORAL | 1 refills | Status: DC

## 2020-03-11 MED ORDER — LAMOTRIGINE 150 MG PO TABS: 150.0000 mg | ORAL_TABLET | Freq: Every day | ORAL | 1 refills | Status: DC

## 2020-03-11 NOTE — Progress Notes (Signed)
Jessica Suarez [id number] 1980-11-01 39 y.o.  Subjective:   Patient ID:  Jessica Suarez is a 39 y.o. (DOB 1980-06-26) female.  Chief Complaint:  Chief Complaint  Patient presents with   Follow-up    Anxiety, mood disturbance, ADHD, and sleep disturbance    HPI Jessica Suarez presents to the office today for follow-up of mood disturbance, anxiety, sleeping disturbance, and ADHD.  Reports that she has been intentionally losing weight. She reports that she has been feeling better physically with eating healthier foods with less GI s/s. 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 She continues to have some issues with her supervisor, to include the way he speaks to her and the other supervisors. She reports that she has frustration in certain situations. She reports that her mood has been ok overall. Anxiety has been manageable. She reports that most of her anxiety and mood s/s are "situational." Reports that she is getting more sleep and is trying to do less work from home. Denies SI.  She reports that she has cut down on her ETOH use. She reports that she is concerned about husband's ETOH use. She reports that his ETOH use interferes with his responsibilities at home. She reports that she is also concerned about their intimacy and wanting to become pregnant.  Learned that she may be eligible for a personal growth leave for 4 weeks next year.   She reports that she has difficulties refilling her medications. She reports that she missed Lamictal for several days and felt dizzy. She then found Lamictal at home and took one immediately and then dizziness resolved.   Past Medication Trials: Latuda-"did not like how it made me feel" Seroquel Wellbutrin-ineffective Zoloft Effexor Prozac-helpful for PMDD signs and symptoms Lexapro Provigil-not as helpful for  daytime somnolence Methylphenidate BuSpar-helpful for anxiety Lamictal-helpful for mood Propranolol   AIMS     Office Visit from 12/10/2019 in [facility]  AIMS Total Score 0       Review of Systems:  Review of Systems  Cardiovascular: Negative for palpitations.  Musculoskeletal: Negative for gait problem.  Neurological: Negative for tremors.  Psychiatric/Behavioral:       Please refer to HPI    Medications: I have reviewed the patient's current medications.  Current Outpatient Medications  Medication Sig Dispense Refill   APPLE CIDER VINEGAR PO Take by mouth.     busPIRone (BUSPAR) 15 MG tablet Take 1 tablet (15 mg total) by mouth 2 (two) times daily. 180 tablet 1   EPINEPHrine (EPIPEN 2-PAK) 0.3 mg/0.3 mL IJ SOAJ injection Inject into the muscle as needed (INJECTION PROTOCOL).     FLUoxetine (PROZAC) 20 MG capsule TAKE 1 CAPSULE BY MOUTH DAILY 1-2 WEEKS PRIOR TO MENSES (Patient not taking: Reported on 08/08/2019) 90 capsule 0   guanFACINE (TENEX) 1 MG tablet TAKE 1 TABLET TWICE DAILY 180 tablet 0   ibuprofen (ADVIL,MOTRIN) 600 MG tablet TAKE 1 TABLET EVERY 6 TO 8 HOURS AS NEEDED FOR PAIN  0   lamoTRIgine (LAMICTAL) 150 MG tablet Take 1 tablet (150 mg total) by mouth daily. 90 tablet 1   levocetirizine (XYZAL) 5 MG tablet Take 5 mg by mouth every evening.     [START ON 05/19/2020] methylphenidate (RITALIN) 20 MG tablet Take 1/2-1 po BID 60 tablet 0   [START ON 04/21/2020] methylphenidate (RITALIN) 20 MG tablet  Take 1/2-1 po BID 60 tablet 0   [START ON 03/24/2020] methylphenidate (RITALIN) 20 MG tablet Take 1/2-1 po BID 60 tablet 0   propranolol (INDERAL) 10 MG tablet TAKE 1 TO 2 TABLETS BY MOUTH TWICE A DAY AS NEEDED FOR ANXIETY 360 tablet 1   QUEtiapine (SEROQUEL) 100 MG tablet TAKE 2-3 TABLETS BY MOUTH AT BEDTIME 270 tablet 1   No current facility-administered medications for this visit.    Medication Side Effects: None  Allergies: No Known  Allergies  Past Medical History:  Diagnosis Date   Allergic rhinitis    Depression    History of Hodgkin's lymphoma MAY 2004   IBS (irritable bowel syndrome)    Migraines    Sleep disorder     Family History  Problem Relation Age of Onset   Hypertension Mother    Hypercholesterolemia Mother    Asthma Father    Diabetes Father    Asthma Brother    Bipolar disorder Brother    Suicidality Brother    Drug abuse Paternal Aunt    Aneurysm Paternal Aunt    Anxiety disorder Other    Cancer Paternal Aunt     Social History   Socioeconomic History   Marital status: Single    Spouse name: Not on file   Number of children: Not on file   Years of education: Not on file   Highest education level: Not on file  Occupational History   Not on file  Tobacco Use   Smoking status: Passive Smoke Exposure - Never Smoker   Smokeless tobacco: Never Used  Substance and Sexual Activity   Alcohol use: Yes    Alcohol/week: 2.0 standard drinks    Types: 2 Shots of liquor per week   Drug use: No   Sexual activity: Yes    Birth control/protection: None  Other Topics Concern   Not on file  Social History Narrative   Not on file   Social Determinants of Health   Financial Resource Strain:    Difficulty of Paying Living Expenses: Not on file  Food Insecurity:    Worried About Programme researcher, broadcasting/film/video in the Last Year: Not on file   [employer] of Food in the Last Year: Not on file  Transportation Needs:    Lack of Transportation (Medical): Not on file   Lack of Transportation (Non-Medical): Not on file  Physical Activity:    Days of Exercise per Week: Not on file   Minutes of Exercise per Session: Not on file  Stress:    Feeling of Stress : Not on file  Social Connections:    Frequency of Communication with Friends and Family: Not on file   Frequency of Social Gatherings with Friends and Family: Not on file   Attends Religious Services: Not on file    Active Member of Clubs or Organizations: Not on file   Attends Banker Meetings: Not on file   Marital Status: Not on file  Intimate Partner Violence:    Fear of Current or Ex-Partner: Not on file   Emotionally Abused: Not on file   Physically Abused: Not on file   Sexually Abused: Not on file    Past Medical History, Surgical history, Social history, and Family history were reviewed and updated as appropriate.   Please see review of systems for further details on the patient's review from today.   Objective:   Physical Exam:  BP 133/84    Pulse 91  Wt 150 lb (68 kg)   Physical Exam Constitutional:      General: She is not in acute distress. Musculoskeletal:        General: No deformity.  Neurological:     Mental Status: She is alert and oriented to person, place, and time.     Coordination: Coordination normal.  Psychiatric:        Attention and Perception: Attention and perception normal. She does not perceive auditory or visual hallucinations.        Mood and Affect: Mood normal. Mood is not anxious or depressed. Affect is not labile, blunt, angry or inappropriate.        Speech: Speech normal.        Behavior: Behavior normal.        Thought Content: Thought content normal. Thought content is not paranoid or delusional. Thought content does not include homicidal or suicidal ideation. Thought content does not include homicidal or suicidal plan.        Cognition and Memory: Cognition and memory normal.        Judgment: Judgment normal.     Comments: Insight intact     Lab Review:  No results found for: NA, K, CL, CO2, GLUCOSE, BUN, CREATININE, CALCIUM, PROT, ALBUMIN, AST, ALT, ALKPHOS, BILITOT, GFRNONAA, GFRAA  No results found for: WBC, RBC, HGB, HCT, PLT, MCV, MCH, MCHC, RDW, LYMPHSABS, MONOABS, EOSABS, BASOSABS  No results found for: POCLITH, LITHIUM   No results found for: PHENYTOIN, PHENOBARB, VALPROATE, CBMZ   .res Assessment: Plan:    Pt seen for 45 minutes and time spent discussing strategies to approaching situational and marital stressors. Provided pt with therapy referrals and encouraged pt to consider marital therapy. Discussed individual therapy may be beneficial if husband declines marital therapy. Provided positive reinforcement for setting boundaries at work and limiting the amount of work that she does outside of scheduled work hours.  Will continue current medications since pt reports that mood and anxiety s/s are well controlled overall aside from occasional mild, transient s/s in the context of psychosocial stressors.  Pt to follow-up in 3 months or sooner if clinically indicated. Patient advised to contact office with any questions, adverse effects, or acute worsening in signs and symptoms.   Braden was seen today for follow-up.  Diagnoses and all orders for this visit:  Social anxiety disorder -     busPIRone (BUSPAR) 15 MG tablet; Take 1 tablet (15 mg total) by mouth 2 (two) times daily. -     propranolol (INDERAL) 10 MG tablet; TAKE 1 TO 2 TABLETS BY MOUTH TWICE A DAY AS NEEDED FOR ANXIETY  Bipolar II disorder (HCC) -     lamoTRIgine (LAMICTAL) 150 MG tablet; Take 1 tablet (150 mg total) by mouth daily. -     QUEtiapine (SEROQUEL) 100 MG tablet; TAKE 2-3 TABLETS BY MOUTH AT BEDTIME  Attention deficit disorder (ADD) without hyperactivity -     methylphenidate (RITALIN) 20 MG tablet; Take 1/2-1 po BID -     methylphenidate (RITALIN) 20 MG tablet; Take 1/2-1 po BID -     methylphenidate (RITALIN) 20 MG tablet; Take 1/2-1 po BID     Please see After Visit Summary for patient specific instructions.  Future Appointments  Date Time Provider Department Center  06/11/2020  8:30 AM Corie Chiquito, PMHNP [facility] None    No orders of the defined types were placed in this encounter.   -------------------------------

## 2020-03-31 ENCOUNTER — Other Ambulatory Visit: Payer: Self-pay

## 2020-03-31 ENCOUNTER — Ambulatory Visit (INDEPENDENT_AMBULATORY_CARE_PROVIDER_SITE_OTHER): Payer: 59 | Admitting: Mental Health

## 2020-03-31 DIAGNOSIS — R69 Illness, unspecified: Secondary | ICD-10-CM | POA: Diagnosis not present

## 2020-03-31 DIAGNOSIS — F4010 Social phobia, unspecified: Secondary | ICD-10-CM

## 2020-03-31 DIAGNOSIS — F3181 Bipolar II disorder: Secondary | ICD-10-CM | POA: Diagnosis not present

## 2020-03-31 NOTE — Progress Notes (Signed)
[facility] Initial Adult Exam  Name: Jessica Suarez Date: 03/31/2020 MRN: [id number] DOB: Oct 19, 1980 PCP: Kathyrn Lass, MD  Time spent: 53 minutes  Reason for Visit /Presenting Problem: Patient reports trying therapy a few years ago but did not feel it al that helpful. She stated she needed "revalation" in therapy stated she needs it to be more "realistic" not so procedural. Reports having bouts of depression and anxiety. She sees Thayer Headings, NP at our practice for the past 1-2 years. She stated she is having marital stress. She stated he is resistant to coming to therapy. They married in 2018, met in 2006. She stated he can "put the walls up". She has concerns, does not feel like they are moving forward to the "next chapter". She stated he drinks a lot; blames her for their lack of intimacy. She works 3rd shift he works 1st shift. She stated she has a meticulous and has a routine prior to her going in to work; she stated he will often try and engage her intimately or argue w/ her prior going to work. This has improved lately. She tries to get him to understand what he does or says affects her. She sated he criticizes her re; how she acts when around his / their friends. He may say she laughs to loud, that she's "too friendly" w/ others. He feels she has joined in when his family has been critical of him.  She is often to scared to bring up issues. She wants to be able to effectively express herself as opposed to supressing them. She wants the confidence to talk and approach talking to him effectively.   Mental Status Exam:   Appearance:   Casual     Behavior:  Appropriate  Motor:  Normal  Speech/Language:   Clear and Coherent  Affect:  Full range  Mood:  anxious  Thought process:  normal  Thought content:    WNL  Sensory/Perceptual disturbances:    none  Orientation:  x4  Attention:  good  Concentration:  Good  Memory:  WNL  Fund of knowledge:   Good  Insight:    Good   Judgment:   Good  Impulse Control:  Good   Reported Symptoms:  Anxiety, some sad feelings  Risk Assessment: Danger to Self:  No Self-injurious Behavior: No Danger to Others: No Duty to Warn:no Physical Aggression / Violence:No  Access to Firearms a concern: No  Gang Involvement:No  Patient / guardian was educated about steps to take if suicide or homicide risk level increases between visits: yes While future psychiatric events cannot be accurately predicted, the patient does not currently require acute inpatient psychiatric care and does not currently meet [facility] involuntary commitment criteria.  Medical History/Surgical History: Past Medical History:  Diagnosis Date  . Allergic rhinitis   . Depression   . History of Hodgkin's lymphoma MAY 2004  . IBS (irritable bowel syndrome)   . Migraines   . Sleep disorder     Past Surgical History:  Procedure Laterality Date  . CHOLECYSTECTOMY    . LYMPH NODE BIOPSY      Medications: Current Outpatient Medications  Medication Sig Dispense Refill  . APPLE CIDER VINEGAR PO Take by mouth.    . busPIRone (BUSPAR) 15 MG tablet Take 1 tablet (15 mg total) by mouth 2 (two) times daily. 180 tablet 1  . EPINEPHrine (EPIPEN 2-PAK) 0.3 mg/0.3 mL IJ SOAJ injection Inject into the muscle as needed (INJECTION PROTOCOL).    Marland Kitchen  FLUoxetine (PROZAC) 20 MG capsule TAKE 1 CAPSULE BY MOUTH DAILY 1-2 WEEKS PRIOR TO MENSES (Patient not taking: Reported on 08/08/2019) 90 capsule 0  . guanFACINE (TENEX) 1 MG tablet TAKE 1 TABLET TWICE DAILY 180 tablet 0  . ibuprofen (ADVIL,MOTRIN) 600 MG tablet TAKE 1 TABLET EVERY 6 TO 8 HOURS AS NEEDED FOR PAIN  0  . lamoTRIgine (LAMICTAL) 150 MG tablet Take 1 tablet (150 mg total) by mouth daily. 90 tablet 1  . levocetirizine (XYZAL) 5 MG tablet Take 5 mg by mouth every evening.    Derrill Memo ON 05/19/2020] methylphenidate (RITALIN) 20 MG tablet Take 1/2-1 po BID 60 tablet 0  . [START ON 04/21/2020] methylphenidate  (RITALIN) 20 MG tablet Take 1/2-1 po BID 60 tablet 0  . methylphenidate (RITALIN) 20 MG tablet Take 1/2-1 po BID 60 tablet 0  . propranolol (INDERAL) 10 MG tablet TAKE 1 TO 2 TABLETS BY MOUTH TWICE A DAY AS NEEDED FOR ANXIETY 360 tablet 1  . QUEtiapine (SEROQUEL) 100 MG tablet TAKE 2-3 TABLETS BY MOUTH AT BEDTIME 270 tablet 1   No current facility-administered medications for this visit.    No Known Allergies  Diagnoses:    ICD-10-CM   1. Social anxiety disorder  F40.10   2. Bipolar II disorder ([place])  F31.81     Plan of Care: TBD   [place], [facility]

## 2020-04-13 ENCOUNTER — Ambulatory Visit (INDEPENDENT_AMBULATORY_CARE_PROVIDER_SITE_OTHER): Payer: 59 | Admitting: Mental Health

## 2020-04-13 ENCOUNTER — Other Ambulatory Visit: Payer: Self-pay

## 2020-04-13 DIAGNOSIS — F4010 Social phobia, unspecified: Secondary | ICD-10-CM

## 2020-04-13 DIAGNOSIS — F3181 Bipolar II disorder: Secondary | ICD-10-CM

## 2020-04-13 DIAGNOSIS — R69 Illness, unspecified: Secondary | ICD-10-CM | POA: Diagnosis not present

## 2020-04-13 NOTE — Progress Notes (Signed)
[facility] Psych[facility] note  Name: Jessica Suarez Date: 04/13/2020 MRN: [id number] DOB: 09-11-1980 PCP: Sigmund Hazel, MD  Time spent: 53 minutes  Treatment: Ind. therapy  Mental Status Exam:   Appearance:   Casual     Behavior:  Appropriate  Motor:  Normal  Speech/Language:   Clear and Coherent  Affect:  Full range  Mood:  anxious  Thought process:  normal  Thought content:    WNL  Sensory/Perceptual disturbances:    none  Orientation:  x4  Attention:  good  Concentration:  Good  Memory:  WNL  Fund of knowledge:   Good  Insight:    Good  Judgment:   Good  Impulse Control:  Good   Reported Symptoms:  Anxiety, some sad feelings  Risk Assessment: Danger to Self:  No Self-injurious Behavior: No Danger to Others: No Duty to Warn:no Physical Aggression / Violence:No  Access to Firearms a concern: No  Gang Involvement:No  Patient / guardian was educated about steps to take if suicide or homicide risk level increases between visits: yes While future psychiatric events cannot be accurately predicted, the patient does not currently require acute inpatient psychiatric care and does not currently meet [facility] involuntary commitment criteria.  Medical History/Surgical History: Past Medical History:  Diagnosis Date  . Allergic rhinitis   . Depression   . History of Hodgkin's lymphoma MAY 2004  . IBS (irritable bowel syndrome)   . Migraines   . Sleep disorder     Past Surgical History:  Procedure Laterality Date  . CHOLECYSTECTOMY    . LYMPH NODE BIOPSY      Medications: Current Outpatient Medications  Medication Sig Dispense Refill  . APPLE CIDER VINEGAR PO Take by mouth.    . busPIRone (BUSPAR) 15 MG tablet Take 1 tablet (15 mg total) by mouth 2 (two) times daily. 180 tablet 1  . EPINEPHrine (EPIPEN 2-PAK) 0.3 mg/0.3 mL IJ SOAJ injection Inject into the muscle as needed (INJECTION PROTOCOL).    Marland Kitchen FLUoxetine (PROZAC) 20 MG capsule TAKE 1 CAPSULE  BY MOUTH DAILY 1-2 WEEKS PRIOR TO MENSES (Patient not taking: Reported on 08/08/2019) 90 capsule 0  . guanFACINE (TENEX) 1 MG tablet TAKE 1 TABLET TWICE DAILY 180 tablet 0  . ibuprofen (ADVIL,MOTRIN) 600 MG tablet TAKE 1 TABLET EVERY 6 TO 8 HOURS AS NEEDED FOR PAIN  0  . lamoTRIgine (LAMICTAL) 150 MG tablet Take 1 tablet (150 mg total) by mouth daily. 90 tablet 1  . levocetirizine (XYZAL) 5 MG tablet Take 5 mg by mouth every evening.    [name] 05/19/2020] methylphenidate (RITALIN) 20 MG tablet Take 1/2-1 po BID 60 tablet 0  . [START ON 04/21/2020] methylphenidate (RITALIN) 20 MG tablet Take 1/2-1 po BID 60 tablet 0  . methylphenidate (RITALIN) 20 MG tablet Take 1/2-1 po BID 60 tablet 0  . propranolol (INDERAL) 10 MG tablet TAKE 1 TO 2 TABLETS BY MOUTH TWICE A DAY AS NEEDED FOR ANXIETY 360 tablet 1  . QUEtiapine (SEROQUEL) 100 MG tablet TAKE 2-3 TABLETS BY MOUTH AT BEDTIME 270 tablet 1   No current facility-administered medications for this visit.   Abuse History: Victim- none stated Report needed: No. Victim of Neglect:No. Perpetrator of none  Witness / Exposure to Domestic Violence: No   Protective Services Involvement: No  Witness to [employer] Violence:  No   Family History: Raised by both parents   Renae Fickle and Sheldon    2 brothers: Trinna Post [phone]) Ronnald Nian (46).  Pt youngest of the 3 siblings.   Family History  Problem Relation Age of Onset  . Hypertension Mother   . Hypercholesterolemia Mother   . Asthma Father   . Diabetes Father   . Asthma Brother   . Bipolar disorder Brother   . Suicidality Brother   . Drug abuse Paternal Aunt   . Aneurysm Paternal Aunt   . Anxiety disorder Other   . Cancer Paternal Aunt     Social History:  Social History   Socioeconomic History  . Marital status: Single    Spouse name: Not on file  . Number of children: Not on file  . Years of education: Not on file  . Highest education level: Not on file  Occupational History  . Not on file  Tobacco  Use  . Smoking status: Passive Smoke Exposure - Never Smoker  . Smokeless tobacco: Never Used  Substance and Sexual Activity  . Alcohol use: Yes    Alcohol/week: 2.0 standard drinks    Types: 2 Shots of liquor per week  . Drug use: No  . Sexual activity: Yes    Birth control/protection: None  Other Topics Concern  . Not on file  Social History Narrative  . 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  She stated that she and her husband got into a couple of arguments recently.  She stated that he struggles with daily alcohol use.  She stated that is been a source of distress in the relationship as she is wanting him to decrease and/or stop his use altogether.  She admitted that she has struggled with using alcohol more often than she would like in the past, currently stating that she may have a drink but has cut back considerably.  She went on to process thoughts and feelings related to  the stress of their relationship.  She stated that  "I can get very heated", when upset during some of the arguments.  We explored collaboratively how she knows when she may be getting to this point as she expressed wanting to manage her behaviors differently in the situations.  Through guided discovery, patient tearfully admitted that she can only get pregnant through in vitro fertilization, how her husband is not supportive of her engaging in this process.  She wants to have a child, however, she stated that he will not change his mind.  She identified this is  one of the main stressors in her life.  Provide support and understanding throughout as patient processed emotions related.  Interventions: Further assessment, CBT, supportive therapy  Diagnoses:    ICD-10-CM   1. Social anxiety disorder  F40.10   2. Bipolar II disorder (HCC)  F31.81      Plan: Patient is to use CBT, mindfulness and coping skills to help manage decrease symptoms. Patient is to increase her ability to identify when feeling emotionally distressed, upset to better regulate her responses in her marital relationship.  Patient to continue to avoid excessive alcohol use as a coping tool for her stress.   Long-term goal:  Reduce overall level, frequency, and intensity of the feelings of depression, anxiety so that daily functioning is not impaired.  Patient to increase effective communication in her marital relationship.   Short-term goal: To identify and process feelings related to the disappointment of past painful events that increase emotional distress.                   Verbally express understanding of the relationship between depressed mood and repression of feelings - such as  anger, hurt, and sadness.                              Patient to improve effective communication in her marital relationship  Assessment of progress:  progressing    Waldron Session, [facility]

## 2020-04-29 ENCOUNTER — Telehealth: Payer: Self-pay | Admitting: Psychiatry

## 2020-04-29 DIAGNOSIS — F988 Other specified behavioral and emotional disorders with onset usually occurring in childhood and adolescence: Secondary | ICD-10-CM

## 2020-04-29 MED ORDER — METHYLPHENIDATE HCL 20 MG PO TABS: ORAL_TABLET | ORAL | 0 refills | Status: DC

## 2020-04-29 NOTE — Telephone Encounter (Signed)
Pt asked if she can get her rx for Methylphenidate resent to [facility] on [address]. [facility] said [facility], and its on back order. Pt is completely out.

## 2020-05-11 ENCOUNTER — Ambulatory Visit (INDEPENDENT_AMBULATORY_CARE_PROVIDER_SITE_OTHER): Payer: 59 | Admitting: Mental Health

## 2020-05-11 ENCOUNTER — Other Ambulatory Visit: Payer: Self-pay

## 2020-05-11 DIAGNOSIS — F4010 Social phobia, unspecified: Secondary | ICD-10-CM | POA: Diagnosis not present

## 2020-05-11 DIAGNOSIS — R69 Illness, unspecified: Secondary | ICD-10-CM | POA: Diagnosis not present

## 2020-05-11 NOTE — Progress Notes (Signed)
[facility] Psych[facility] note  Name: Jessica Suarez Date: 05/11/2020 MRN: [id number] DOB: [birth year] PCP: Sigmund Hazel, MD  Time spent: 53 minutes  Treatment: Ind. therapy  Mental Status Exam:   Appearance:   Casual     Behavior:  Appropriate  Motor:  Normal  Speech/Language:   Clear and Coherent  Affect:  Full range  Mood:  anxious  Thought process:  normal  Thought content:    WNL  Sensory/Perceptual disturbances:    none  Orientation:  x4  Attention:  good  Concentration:  Good  Memory:  WNL  Fund of knowledge:   Good  Insight:    Good  Judgment:   Good  Impulse Control:  Good   Reported Symptoms:  Anxiety, some sad feelings  Risk Assessment: Danger to Self:  No Self-injurious Behavior: No Danger to Others: No Duty to Warn:no Physical Aggression / Violence:No  Access to Firearms a concern: No  Gang Involvement:No  Patient / guardian was educated about steps to take if suicide or homicide risk level increases between visits: yes While future psychiatric events cannot be accurately predicted, the patient does not currently require acute inpatient psychiatric care and does not currently meet [facility] involuntary commitment criteria.  Medical History/Surgical History: Past Medical History:  Diagnosis Date  . Allergic rhinitis   . Depression   . History of Hodgkin's lymphoma MAY 2004  . IBS (irritable bowel syndrome)   . Migraines   . Sleep disorder     Past Surgical History:  Procedure Laterality Date  . CHOLECYSTECTOMY    . LYMPH NODE BIOPSY      Medications: Current Outpatient Medications  Medication Sig Dispense Refill  . APPLE CIDER VINEGAR PO Take by mouth.    . busPIRone (BUSPAR) 15 MG tablet Take 1 tablet (15 mg total) by mouth 2 (two) times daily. 180 tablet 1  . EPINEPHrine (EPIPEN 2-PAK) 0.3 mg/0.3 mL IJ SOAJ injection Inject into the muscle as needed (INJECTION PROTOCOL).    Marland Kitchen FLUoxetine (PROZAC) 20 MG capsule TAKE 1 CAPSULE  BY MOUTH DAILY 1-2 WEEKS PRIOR TO MENSES (Patient not taking: Reported on 08/08/2019) 90 capsule 0  . guanFACINE (TENEX) 1 MG tablet TAKE 1 TABLET TWICE DAILY 180 tablet 0  . ibuprofen (ADVIL,MOTRIN) 600 MG tablet TAKE 1 TABLET EVERY 6 TO 8 HOURS AS NEEDED FOR PAIN  0  . lamoTRIgine (LAMICTAL) 150 MG tablet Take 1 tablet (150 mg total) by mouth daily. 90 tablet 1  . levocetirizine (XYZAL) 5 MG tablet Take 5 mg by mouth every evening.    [name] 05/19/2020] methylphenidate (RITALIN) 20 MG tablet Take 1/2-1 po BID 60 tablet 0  . methylphenidate (RITALIN) 20 MG tablet Take 1/2-1 po BID 60 tablet 0  . methylphenidate (RITALIN) 20 MG tablet Take 1/2-1 po BID 60 tablet 0  . propranolol (INDERAL) 10 MG tablet TAKE 1 TO 2 TABLETS BY MOUTH TWICE A DAY AS NEEDED FOR ANXIETY 360 tablet 1  . QUEtiapine (SEROQUEL) 100 MG tablet TAKE 2-3 TABLETS BY MOUTH AT BEDTIME 270 tablet 1   No current facility-administered medications for this visit.   Subjective: Patient arrived on time for today's session.  She shared progress, recent events since her last visit which was about a month ago.  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  She stated that she has now worked with him for the past few years, and  has been able to identify some effective changes she has made and continuing to be able to work with him.  She stated that he often can be condescending to others where she tries to confront him directly but also wants to be professional where she feels she is doing so.  Explored her communication and we discussed some communication strategies, allowing herself to increase her awareness of identifying different thoughts and feelings she may have prior to her reacting.  She went on to share marital strain that has persisted over the last few weeks, she stated her husband continues to use alcohol excessively, over the Christmas holidays, they had  increased relational problems.  She stated that she has felt depressed as a result, that she has been depressed for years but more so due to the marital situation.  We plan to discuss more with patient next session regarding this history and continued current stressors.  She stated that he has made some efforts, has cut back significantly on his drinking over the past 1 to 2 weeks.  Interventions: Further assessment, CBT, supportive therapy  Diagnoses:    ICD-10-CM   1. Social anxiety disorder  F40.10      Plan: Patient is to use CBT, mindfulness and coping skills to help manage decrease symptoms. Patient is to increase her ability to identify when feeling emotionally distressed, upset to better regulate her responses in her marital relationship.  Patient to continue to avoid excessive alcohol use as a coping tool for her stress.   Long-term goal:  Reduce overall level, frequency, and intensity of the feelings of depression, anxiety so that daily functioning is not impaired.  Patient to increase effective communication in her marital relationship.   Short-term goal: To identify and process feelings related to the disappointment of past painful events that increase emotional distress.                   Verbally express understanding of the relationship between depressed mood and repression of feelings - such as  anger, hurt, and sadness.                              Patient to improve effective communication in her marital relationship  Assessment of progress:  progressing    Waldron Session, [facility]

## 2020-05-25 ENCOUNTER — Other Ambulatory Visit: Payer: Self-pay

## 2020-05-25 ENCOUNTER — Ambulatory Visit (INDEPENDENT_AMBULATORY_CARE_PROVIDER_SITE_OTHER): Payer: 59 | Admitting: Mental Health

## 2020-05-25 DIAGNOSIS — F4010 Social phobia, unspecified: Secondary | ICD-10-CM

## 2020-05-25 DIAGNOSIS — R69 Illness, unspecified: Secondary | ICD-10-CM | POA: Diagnosis not present

## 2020-05-25 NOTE — Progress Notes (Signed)
[facility] Psych[facility] note  Name: Jessica Suarez Date: 05/25/2020 MRN: [id number] DOB: 02-14-1981 PCP: Sigmund Hazel, MD  Time spent: 53 minutes  Treatment: Ind. therapy  Mental Status Exam:   Appearance:   Casual     Behavior:  Appropriate  Motor:  Normal  Speech/Language:   Clear and Coherent  Affect:  Full range  Mood:  anxious  Thought process:  normal  Thought content:    WNL  Sensory/Perceptual disturbances:    none  Orientation:  x4  Attention:  good  Concentration:  Good  Memory:  WNL  Fund of knowledge:   Good  Insight:    Good  Judgment:   Good  Impulse Control:  Good   Reported Symptoms:  Anxiety, some sad feelings  Risk Assessment: Danger to Self:  No Self-injurious Behavior: No Danger to Others: No Duty to Warn:no Physical Aggression / Violence:No  Access to Firearms a concern: No  Gang Involvement:No  Patient / guardian was educated about steps to take if suicide or homicide risk level increases between visits: yes While future psychiatric events cannot be accurately predicted, the patient does not currently require acute inpatient psychiatric care and does not currently meet [facility] involuntary commitment criteria.  Medical History/Surgical History: Past Medical History:  Diagnosis Date  . Allergic rhinitis   . Depression   . History of Hodgkin's lymphoma MAY 2004  . IBS (irritable bowel syndrome)   . Migraines   . Sleep disorder     Past Surgical History:  Procedure Laterality Date  . CHOLECYSTECTOMY    . LYMPH NODE BIOPSY      Medications: Current Outpatient Medications  Medication Sig Dispense Refill  . APPLE CIDER VINEGAR PO Take by mouth.    . busPIRone (BUSPAR) 15 MG tablet Take 1 tablet (15 mg total) by mouth 2 (two) times daily. 180 tablet 1  . EPINEPHrine (EPIPEN 2-PAK) 0.3 mg/0.3 mL IJ SOAJ injection Inject into the muscle as needed (INJECTION PROTOCOL).    Marland Kitchen FLUoxetine (PROZAC) 20 MG capsule TAKE 1 CAPSULE  BY MOUTH DAILY 1-2 WEEKS PRIOR TO MENSES (Patient not taking: Reported on 08/08/2019) 90 capsule 0  . guanFACINE (TENEX) 1 MG tablet TAKE 1 TABLET TWICE DAILY 180 tablet 0  . ibuprofen (ADVIL,MOTRIN) 600 MG tablet TAKE 1 TABLET EVERY 6 TO 8 HOURS AS NEEDED FOR PAIN  0  . lamoTRIgine (LAMICTAL) 150 MG tablet Take 1 tablet (150 mg total) by mouth daily. 90 tablet 1  . levocetirizine (XYZAL) 5 MG tablet Take 5 mg by mouth every evening.    . methylphenidate (RITALIN) 20 MG tablet Take 1/2-1 po BID 60 tablet 0  . methylphenidate (RITALIN) 20 MG tablet Take 1/2-1 po BID 60 tablet 0  . methylphenidate (RITALIN) 20 MG tablet Take 1/2-1 po BID 60 tablet 0  . propranolol (INDERAL) 10 MG tablet TAKE 1 TO 2 TABLETS BY MOUTH TWICE A DAY AS NEEDED FOR ANXIETY 360 tablet 1  . QUEtiapine (SEROQUEL) 100 MG tablet TAKE 2-3 TABLETS BY MOUTH AT BEDTIME 270 tablet 1   No current facility-administered medications for this visit.   Subjective: Patient arrived on time for today's session.  She shared how there are continues to be barriers with communication in her marital relationship going on to share several details.  She continues to identify also wanting to not resort to getting upset to the point where she is yelling or throwing things when angry.  She shared how she is pleased that her  husband has significantly reduced his alcohol use and feels that this is been 1+ change in the relationship albeit continues to feel that they have significant challenges with her communication.  Interventions: Further assessment, CBT, supportive therapy  Diagnoses:    ICD-10-CM   1. Social anxiety disorder  F40.10      Plan: Patient is to use CBT, mindfulness and coping skills to help manage decrease symptoms. Patient is to increase her ability to identify when feeling emotionally distressed, upset to better regulate her responses in her marital relationship.  Patient to continue to avoid excessive alcohol use as a coping tool  for her stress.   Long-term goal:  Reduce overall level, frequency, and intensity of the feelings of depression, anxiety so that daily functioning is not impaired.  Patient to increase effective communication in her marital relationship.   Short-term goal: To identify and process feelings related to the disappointment of past painful events that increase emotional distress.                   Verbally express understanding of the relationship between depressed mood and repression of feelings - such as  anger, hurt, and sadness.                              Patient to improve effective communication in her marital relationship  Assessment of progress:  progressing    Waldron Session, [facility]

## 2020-06-08 ENCOUNTER — Ambulatory Visit (INDEPENDENT_AMBULATORY_CARE_PROVIDER_SITE_OTHER): Payer: 59 | Admitting: Mental Health

## 2020-06-08 ENCOUNTER — Other Ambulatory Visit: Payer: Self-pay

## 2020-06-08 DIAGNOSIS — F4010 Social phobia, unspecified: Secondary | ICD-10-CM | POA: Diagnosis not present

## 2020-06-08 DIAGNOSIS — R69 Illness, unspecified: Secondary | ICD-10-CM | POA: Diagnosis not present

## 2020-06-08 NOTE — Progress Notes (Signed)
[facility] Psych[facility] note  Name: Jessica Suarez Date: 06/08/2020 MRN: [id number] DOB: Nov 06, 1980 PCP: Sigmund Hazel, MD  Time spent: 53 minutes  Treatment: Ind. therapy  Mental Status Exam:   Appearance:   Casual     Behavior:  Appropriate  Motor:  Normal  Speech/Language:   Clear and Coherent  Affect:  Full range  Mood:  anxious  Thought process:  normal  Thought content:    WNL  Sensory/Perceptual disturbances:    none  Orientation:  x4  Attention:  good  Concentration:  Good  Memory:  WNL  Fund of knowledge:   Good  Insight:    Good  Judgment:   Good  Impulse Control:  Good   Reported Symptoms:  Anxiety, some sad feelings   Risk Assessment: Danger to Self:  No Self-injurious Behavior: No Danger to Others: No Duty to Warn:no Physical Aggression / Violence:No  Access to Firearms a concern: No  Gang Involvement:No  Patient / guardian was educated about steps to take if suicide or homicide risk level increases between visits: yes While future psychiatric events cannot be accurately predicted, the patient does not currently require acute inpatient psychiatric care and does not currently meet [facility] involuntary commitment criteria.  Medical History/Surgical History: Past Medical History:  Diagnosis Date  . Allergic rhinitis   . Depression   . History of Hodgkin's lymphoma MAY 2004  . IBS (irritable bowel syndrome)   . Migraines   . Sleep disorder     Past Surgical History:  Procedure Laterality Date  . CHOLECYSTECTOMY    . LYMPH NODE BIOPSY      Medications: Current Outpatient Medications  Medication Sig Dispense Refill  . APPLE CIDER VINEGAR PO Take by mouth.    . busPIRone (BUSPAR) 15 MG tablet Take 1 tablet (15 mg total) by mouth 2 (two) times daily. 180 tablet 1  . EPINEPHrine (EPIPEN 2-PAK) 0.3 mg/0.3 mL IJ SOAJ injection Inject into the muscle as needed (INJECTION PROTOCOL).    Marland Kitchen FLUoxetine (PROZAC) 20 MG capsule TAKE 1 CAPSULE  BY MOUTH DAILY 1-2 WEEKS PRIOR TO MENSES (Patient not taking: Reported on 08/08/2019) 90 capsule 0  . guanFACINE (TENEX) 1 MG tablet TAKE 1 TABLET TWICE DAILY 180 tablet 0  . ibuprofen (ADVIL,MOTRIN) 600 MG tablet TAKE 1 TABLET EVERY 6 TO 8 HOURS AS NEEDED FOR PAIN  0  . lamoTRIgine (LAMICTAL) 150 MG tablet Take 1 tablet (150 mg total) by mouth daily. 90 tablet 1  . levocetirizine (XYZAL) 5 MG tablet Take 5 mg by mouth every evening.    . methylphenidate (RITALIN) 20 MG tablet Take 1/2-1 po BID 60 tablet 0  . methylphenidate (RITALIN) 20 MG tablet Take 1/2-1 po BID 60 tablet 0  . methylphenidate (RITALIN) 20 MG tablet Take 1/2-1 po BID 60 tablet 0  . propranolol (INDERAL) 10 MG tablet TAKE 1 TO 2 TABLETS BY MOUTH TWICE A DAY AS NEEDED FOR ANXIETY 360 tablet 1  . QUEtiapine (SEROQUEL) 100 MG tablet TAKE 2-3 TABLETS BY MOUTH AT BEDTIME 270 tablet 1   No current facility-administered medications for this visit.   Subjective: Patient arrived on time for today's session.  Continues to have some marital relationship strain going on to share some details.  She stated that she was going to have a discussion with her husband sharing concerns and needs planned for a weekend, however, she never had this discussion as she noticed that his behavior during the week leading up to the  weekend changed significantly where he was more attentive, thoughtful and kind.  She stated this made it difficult to have the discussion albeit, she still identifies this is a need.  She shared more details related to her work, the significant demands of her job which result in her working about 60 hours/week.  She acknowledges this being a hindrance at times to the relationship, not having of quality time together.  She identified to go discovery, the need to plan specific time for them as a couple and to do this collaboratively with her husband.  Facilitated her identifying thoughts, feelings and needs throughout the  session.   Interventions: Further assessment, CBT, supportive therapy  Diagnoses:    ICD-10-CM   1. Social anxiety disorder  F40.10      Plan: Patient is to use CBT, mindfulness and coping skills to help manage decrease symptoms. Patient is to increase her ability to identify when feeling emotionally distressed, upset to better regulate her responses in her marital relationship.  Patient to continue to avoid excessive alcohol use as a coping tool for her stress.   Long-term goal:  Reduce overall level, frequency, and intensity of the feelings of depression, anxiety so that daily functioning is not impaired.  Patient to increase effective communication in her marital relationship.   Short-term goal: To identify and process feelings related to the disappointment of past painful events that increase emotional distress.                   Verbally express understanding of the relationship between depressed mood and repression of feelings - such as  anger, hurt, and sadness.                              Patient to improve effective communication in her marital relationship  Assessment of progress:  progressing    Waldron Session, [facility]

## 2020-06-11 ENCOUNTER — Ambulatory Visit: Payer: 59 | Admitting: Psychiatry

## 2020-06-22 ENCOUNTER — Ambulatory Visit (INDEPENDENT_AMBULATORY_CARE_PROVIDER_SITE_OTHER): Payer: 59 | Admitting: Mental Health

## 2020-06-22 ENCOUNTER — Other Ambulatory Visit: Payer: Self-pay

## 2020-06-22 DIAGNOSIS — F4010 Social phobia, unspecified: Secondary | ICD-10-CM | POA: Diagnosis not present

## 2020-06-22 DIAGNOSIS — R69 Illness, unspecified: Secondary | ICD-10-CM | POA: Diagnosis not present

## 2020-06-22 NOTE — Progress Notes (Signed)
[facility] note  Name: Jessica Suarez Date: 06/22/2020 MRN: [id number] DOB: 08/07/1980 PCP: Sigmund Hazel, MD  Time spent: 54 minutes  Treatment: Ind. therapy  Mental Status Exam:   Appearance:   Casual     Behavior:  Appropriate  Motor:  Normal  Speech/Language:   Clear and Coherent  Affect:  Full range  Mood:  anxious  Thought process:  normal  Thought content:    WNL  Sensory/Perceptual disturbances:    none  Orientation:  x4  Attention:  good  Concentration:  Good  Memory:  WNL  Fund of knowledge:   Good  Insight:    Good  Judgment:   Good  Impulse Control:  Good   Reported Symptoms:  Anxiety, some sad feelings   Risk Assessment: Danger to Self:  No Self-injurious Behavior: No Danger to Others: No Duty to Warn:no Physical Aggression / Violence:No  Access to Firearms a concern: No  Gang Involvement:No  Patient / guardian was educated about steps to take if suicide or homicide risk level increases between visits: yes While future psychiatric events cannot be accurately predicted, the patient does not currently require acute inpatient psychiatric care and does not currently meet [facility] involuntary commitment criteria.  Medical History/Surgical History: Past Medical History:  Diagnosis Date  . Allergic rhinitis   . Depression   . History of Hodgkin's lymphoma MAY 2004  . IBS (irritable bowel syndrome)   . Migraines   . Sleep disorder     Past Surgical History:  Procedure Laterality Date  . CHOLECYSTECTOMY    . LYMPH NODE BIOPSY      Medications: Current Outpatient Medications  Medication Sig Dispense Refill  . APPLE CIDER VINEGAR PO Take by mouth.    . busPIRone (BUSPAR) 15 MG tablet Take 1 tablet (15 mg total) by mouth 2 (two) times daily. 180 tablet 1  . EPINEPHrine (EPIPEN 2-PAK) 0.3 mg/0.3 mL IJ SOAJ injection Inject into the muscle as needed (INJECTION PROTOCOL).    Marland Kitchen FLUoxetine (PROZAC) 20 MG capsule TAKE 1 CAPSULE  BY MOUTH DAILY 1-2 WEEKS PRIOR TO MENSES (Patient not taking: Reported on 08/08/2019) 90 capsule 0  . guanFACINE (TENEX) 1 MG tablet TAKE 1 TABLET TWICE DAILY 180 tablet 0  . ibuprofen (ADVIL,MOTRIN) 600 MG tablet TAKE 1 TABLET EVERY 6 TO 8 HOURS AS NEEDED FOR PAIN  0  . lamoTRIgine (LAMICTAL) 150 MG tablet Take 1 tablet (150 mg total) by mouth daily. 90 tablet 1  . levocetirizine (XYZAL) 5 MG tablet Take 5 mg by mouth every evening.    . methylphenidate (RITALIN) 20 MG tablet Take 1/2-1 po BID 60 tablet 0  . methylphenidate (RITALIN) 20 MG tablet Take 1/2-1 po BID 60 tablet 0  . methylphenidate (RITALIN) 20 MG tablet Take 1/2-1 po BID 60 tablet 0  . propranolol (INDERAL) 10 MG tablet TAKE 1 TO 2 TABLETS BY MOUTH TWICE A DAY AS NEEDED FOR ANXIETY 360 tablet 1  . QUEtiapine (SEROQUEL) 100 MG tablet TAKE 2-3 TABLETS BY MOUTH AT BEDTIME 270 tablet 1   No current facility-administered medications for this visit.   Subjective: Patient arrived on time for today's session.  She shared continued relational strain in her marriage going on to give details.  She shared how she and her husband had a recent disagreement over the weekend as she sharing that he was unclear in his communication.  She stated that he was upset that they did not attend a get together with  friends however, patient stated that this event was canceled as her husband informed her of this a day or 2 before.  She continues to also have some work stress related to relationship with her supervisor who she feels also lacks providing clear communication and also has difficulty taking responsibility for his miscommunication which others have noticed at her job as well.  She stated she would like to engage in couples counseling however, her husband has been resistant to this idea.  Tested her in identifying needs in her relationship as well as some ways to communicate them effectively, clearly as opposed to her getting upset and frustrated due to  letting her feelings of distress build over time.    Interventions: Further assessment, CBT, supportive therapy  Diagnoses:    ICD-10-CM   1. Social anxiety disorder  F40.10      Plan: Patient is to use CBT, mindfulness and coping skills to help manage decrease symptoms. Patient is to increase her ability to identify when feeling emotionally distressed, upset to better regulate her responses in her marital relationship.  Patient to continue to avoid excessive alcohol use as a coping tool for her stress.   Long-term goal:  Reduce overall level, frequency, and intensity of the feelings of depression, anxiety so that daily functioning is not impaired.  Patient to increase effective communication in her marital relationship.   Short-term goal: To identify and process feelings related to the disappointment of past painful events that increase emotional distress.                   Verbally express understanding of the relationship between depressed mood and repression of feelings - such as  anger, hurt, and sadness.                              Patient to improve effective communication in her marital relationship  Assessment of progress:  progressing    Waldron Session, [facility]

## 2020-06-24 ENCOUNTER — Telehealth: Payer: Self-pay | Admitting: Psychiatry

## 2020-06-24 DIAGNOSIS — F988 Other specified behavioral and emotional disorders with onset usually occurring in childhood and adolescence: Secondary | ICD-10-CM

## 2020-06-24 MED ORDER — METHYLPHENIDATE HCL 20 MG PO TABS: ORAL_TABLET | ORAL | 0 refills | Status: DC

## 2020-06-24 NOTE — Telephone Encounter (Signed)
Script sent  

## 2020-06-24 NOTE — Telephone Encounter (Signed)
Pt called to advise [facility] on back order for methylphenidate. Asking you to send Rx to [facility]

## 2020-07-06 ENCOUNTER — Ambulatory Visit: Payer: 59 | Admitting: Mental Health

## 2020-07-20 ENCOUNTER — Ambulatory Visit (INDEPENDENT_AMBULATORY_CARE_PROVIDER_SITE_OTHER): Payer: 59 | Admitting: Mental Health

## 2020-07-20 ENCOUNTER — Other Ambulatory Visit: Payer: Self-pay

## 2020-07-20 DIAGNOSIS — R69 Illness, unspecified: Secondary | ICD-10-CM | POA: Diagnosis not present

## 2020-07-20 DIAGNOSIS — F4010 Social phobia, unspecified: Secondary | ICD-10-CM | POA: Diagnosis not present

## 2020-07-20 NOTE — Progress Notes (Signed)
[facility] note  Name: Jessica Suarez Date: 07/20/2020 MRN: [id number] DOB: Oct 08, 1980 PCP: Sigmund Hazel, MD  Time spent: 53 minutes  Treatment: Ind. therapy  Mental Status Exam:   Appearance:   Casual     Behavior:  Appropriate  Motor:  Normal  Speech/Language:   Clear and Coherent  Affect:  Full range  Mood:  anxious  Thought process:  normal  Thought content:    WNL  Sensory/Perceptual disturbances:    none  Orientation:  x4  Attention:  good  Concentration:  Good  Memory:  WNL  Fund of knowledge:   Good  Insight:    Good  Judgment:   Good  Impulse Control:  Good   Reported Symptoms:  Anxiety, some sad feelings   Risk Assessment: Danger to Self:  No Self-injurious Behavior: No Danger to Others: No Duty to Warn:no Physical Aggression / Violence:No  Access to Firearms a concern: No  Gang Involvement:No  Patient / guardian was educated about steps to take if suicide or homicide risk level increases between visits: yes While future psychiatric events cannot be accurately predicted, the patient does not currently require acute inpatient psychiatric care and does not currently meet [facility] involuntary commitment criteria.  Medical History/Surgical History: Past Medical History:  Diagnosis Date  . Allergic rhinitis   . Depression   . History of Hodgkin's lymphoma MAY 2004  . IBS (irritable bowel syndrome)   . Migraines   . Sleep disorder     Past Surgical History:  Procedure Laterality Date  . CHOLECYSTECTOMY    . LYMPH NODE BIOPSY      Medications: Current Outpatient Medications  Medication Sig Dispense Refill  . APPLE CIDER VINEGAR PO Take by mouth.    . busPIRone (BUSPAR) 15 MG tablet Take 1 tablet (15 mg total) by mouth 2 (two) times daily. 180 tablet 1  . EPINEPHrine (EPIPEN 2-PAK) 0.3 mg/0.3 mL IJ SOAJ injection Inject into the muscle as needed (INJECTION PROTOCOL).    Marland Kitchen FLUoxetine (PROZAC) 20 MG capsule TAKE 1 CAPSULE  BY MOUTH DAILY 1-2 WEEKS PRIOR TO MENSES (Patient not taking: Reported on 08/08/2019) 90 capsule 0  . guanFACINE (TENEX) 1 MG tablet TAKE 1 TABLET TWICE DAILY 180 tablet 0  . ibuprofen (ADVIL,MOTRIN) 600 MG tablet TAKE 1 TABLET EVERY 6 TO 8 HOURS AS NEEDED FOR PAIN  0  . lamoTRIgine (LAMICTAL) 150 MG tablet Take 1 tablet (150 mg total) by mouth daily. 90 tablet 1  . levocetirizine (XYZAL) 5 MG tablet Take 5 mg by mouth every evening.    . methylphenidate (RITALIN) 20 MG tablet Take 1/2-1 po BID 60 tablet 0  . methylphenidate (RITALIN) 20 MG tablet Take 1/2-1 po BID 60 tablet 0  . methylphenidate (RITALIN) 20 MG tablet Take 1/2-1 po BID 60 tablet 0  . propranolol (INDERAL) 10 MG tablet TAKE 1 TO 2 TABLETS BY MOUTH TWICE A DAY AS NEEDED FOR ANXIETY 360 tablet 1  . QUEtiapine (SEROQUEL) 100 MG tablet TAKE 2-3 TABLETS BY MOUTH AT BEDTIME 270 tablet 1   No current facility-administered medications for this visit.   Subjective: Patient arrived on time for today's session. [name] in progress, how she's been stressed at work recently due to her supervisor, his lack of communication. She stated that he often is not clear about his instructions, sharing a recent incident where she felt undermined by him. She shared how she feels her middle relationship has improved somewhat, her husband is cut down  on his alcohol use. She said she feels upset and does not want to raise her voice at him but she feels that he will often not clearly communicate with her, feels this may relate to some of his family history. She continues to want to improve their relationship, continue to work on her effectively communicating without reacting in anger.   Interventions: Further assessment, CBT, supportive therapy  Diagnoses:    ICD-10-CM   1. Social anxiety disorder  F40.10      Plan: Patient is to use CBT, mindfulness and coping skills to help manage decrease symptoms. Patient is to increase her ability to identify when  feeling emotionally distressed, upset to better regulate her responses in her marital relationship.  Patient to continue to avoid excessive alcohol use as a coping tool for her stress.   Long-term goal:  Reduce overall level, frequency, and intensity of the feelings of depression, anxiety so that daily functioning is not impaired.  Patient to increase effective communication in her marital relationship.   Short-term goal: To identify and process feelings related to the disappointment of past painful events that increase emotional distress.                   Verbally express understanding of the relationship between depressed mood and repression of feelings - such as  anger, hurt, and sadness.                              Patient to improve effective communication in her marital relationship  Assessment of progress:  progressing    Waldron Session, [facility]

## 2020-07-21 ENCOUNTER — Telehealth: Payer: Self-pay | Admitting: Psychiatry

## 2020-07-21 DIAGNOSIS — F988 Other specified behavioral and emotional disorders with onset usually occurring in childhood and adolescence: Secondary | ICD-10-CM

## 2020-07-21 MED ORDER — METHYLPHENIDATE HCL 20 MG PO TABS: ORAL_TABLET | ORAL | 0 refills | Status: DC

## 2020-07-21 NOTE — Telephone Encounter (Signed)
LVM

## 2020-07-21 NOTE — Telephone Encounter (Signed)
Please review

## 2020-07-21 NOTE — Telephone Encounter (Signed)
Please let her know it was sent and can be filled tomorrow.

## 2020-07-21 NOTE — Telephone Encounter (Signed)
Jessica Suarez called in today requesting her prescription for Methylphenidate 20mg  be sent to another pharmacy, states other pharmacy is out. Appt 3/31. She would like it sent to  [facility] [address] [place]

## 2020-07-30 ENCOUNTER — Telehealth (INDEPENDENT_AMBULATORY_CARE_PROVIDER_SITE_OTHER): Payer: 59 | Admitting: Psychiatry

## 2020-07-30 ENCOUNTER — Other Ambulatory Visit: Payer: Self-pay

## 2020-07-30 ENCOUNTER — Encounter: Payer: Self-pay | Admitting: Psychiatry

## 2020-07-30 DIAGNOSIS — F4010 Social phobia, unspecified: Secondary | ICD-10-CM

## 2020-07-30 DIAGNOSIS — F3181 Bipolar II disorder: Secondary | ICD-10-CM

## 2020-07-30 DIAGNOSIS — F988 Other specified behavioral and emotional disorders with onset usually occurring in childhood and adolescence: Secondary | ICD-10-CM

## 2020-07-30 DIAGNOSIS — R69 Illness, unspecified: Secondary | ICD-10-CM | POA: Diagnosis not present

## 2020-07-30 MED ORDER — LAMOTRIGINE 150 MG PO TABS: 150.0000 mg | ORAL_TABLET | Freq: Every day | ORAL | 1 refills | Status: DC

## 2020-07-30 MED ORDER — BUSPIRONE HCL 15 MG PO TABS
15.0000 mg | ORAL_TABLET | Freq: Two times a day (BID) | ORAL | 1 refills | Status: DC
Start: 2020-07-30 — End: 2020-12-07

## 2020-07-30 MED ORDER — GUANFACINE HCL 1 MG PO TABS: ORAL_TABLET | ORAL | 0 refills | Status: DC

## 2020-07-30 MED ORDER — METHYLPHENIDATE HCL 20 MG PO TABS: ORAL_TABLET | ORAL | 0 refills | Status: DC

## 2020-07-30 MED ORDER — QUETIAPINE FUMARATE 100 MG PO TABS: ORAL_TABLET | ORAL | 1 refills | Status: DC

## 2020-07-30 MED ORDER — PROPRANOLOL HCL 10 MG PO TABS: ORAL_TABLET | ORAL | 1 refills | Status: DC

## 2020-07-30 NOTE — Progress Notes (Signed)
Jessica Suarez [id number] Apr 30, 1981 40 y.o.  Subjective:   Patient ID:  Jessica Suarez is a 40 y.o. (DOB 01/27/1981) female.  Chief Complaint:  Chief Complaint  Patient presents with  . Anxiety    HPI Jessica Suarez presents to the office today for follow-up of mood disturbance, anxiety, and insomnia. She reports that she has been seeing Elio Forget, [facility] for therapy. She reports that she would like to see a couples therapist. She reports that she would like help with their communication. She expresses concern about husband's ETOH use. She reports that his ETOH use has recently decreased somewhat.   She reports that she has had anxiety and sadness in response to marital stressors. She also has anxiety in response to supervisor. She reports that she does not feel heard with both her husband and supervisor. She reports that she is not getting adequate sleep. She reports that she has been having later meetings at work that keep her at work longer. She reports low energy. She reports that some interactions are "draining." She reports that she and her husband have been doing some things over the weekend. She reports that she also has time to rest. Concentration has been ok and reports that she is balancing multiple responsibilities. Appetite has been ok. Denies manic s/s. Denies SI.   She is unsure of benefit of Guanfacine. She has been taking it only before bed due to drowsiness and fatigue. She reports adjusting amount of Seroquel based on the amount of time she has available to sleep.   Working 10-12 hours a day.   Past Medication Trials: Latuda-"did not like how it made me feel" Seroquel Wellbutrin-ineffective Zoloft Effexor Prozac-helpful for PMDD signs and symptoms Lexapro Provigil-not as helpful for daytime somnolence Methylphenidate BuSpar-helpful for anxiety Lamictal-helpful for mood Propranolol Guanfacine  AIMS   Flowsheet Row Office Visit from 12/10/2019 in  [facility]  AIMS Total Score 0       Review of Systems:  Review of Systems  Genitourinary:       Amennorhea  Musculoskeletal: Negative for gait problem.  Neurological: Negative for tremors.  Psychiatric/Behavioral:       Please refer to HPI    Medications: I have reviewed the patient's current medications.  Current Outpatient Medications  Medication Sig Dispense Refill  . levocetirizine (XYZAL) 5 MG tablet Take 5 mg by mouth every evening.    . busPIRone (BUSPAR) 15 MG tablet Take 1 tablet (15 mg total) by mouth 2 (two) times daily. 180 tablet 1  . EPINEPHrine (EPIPEN 2-PAK) 0.3 mg/0.3 mL IJ SOAJ injection Inject into the muscle as needed (INJECTION PROTOCOL).    Marland Kitchen guanFACINE (TENEX) 1 MG tablet TAKE 1 TABLET TWICE DAILY 180 tablet 0  . ibuprofen (ADVIL,MOTRIN) 600 MG tablet TAKE 1 TABLET EVERY 6 TO 8 HOURS AS NEEDED FOR PAIN  0  . lamoTRIgine (LAMICTAL) 150 MG tablet Take 1 tablet (150 mg total) by mouth daily. 90 tablet 1  . [START ON 10/19/2020] methylphenidate (RITALIN) 20 MG tablet Take 1/2-1 po BID 60 tablet 0  . [START ON 09/21/2020] methylphenidate (RITALIN) 20 MG tablet Take 1/2-1 po BID 60 tablet 0  . [START ON 08/24/2020] methylphenidate (RITALIN) 20 MG tablet Take 1/2-1 po BID 60 tablet 0  . propranolol (INDERAL) 10 MG tablet TAKE 1 TO 2 TABLETS BY MOUTH TWICE A DAY AS NEEDED FOR ANXIETY 360 tablet 1  . QUEtiapine (SEROQUEL) 100 MG tablet TAKE 2-3 TABLETS BY MOUTH AT BEDTIME  270 tablet 1   No current facility-administered medications for this visit.    Medication Side Effects: None  Allergies: No Known Allergies  Past Medical History:  Diagnosis Date  . Allergic rhinitis   . Depression   . History of Hodgkin's lymphoma MAY 2004  . IBS (irritable bowel syndrome)   . Migraines   . Sleep disorder     Family History  Problem Relation Age of Onset  . Hypertension Mother   . Hypercholesterolemia Mother   . Asthma Father   . Diabetes Father    . Asthma Brother   . Bipolar disorder Brother   . Suicidality Brother   . Drug abuse Paternal Aunt   . Aneurysm Paternal Aunt   . Anxiety disorder Other   . Cancer Paternal Aunt     Social History   Socioeconomic History  . Marital status: Single    Spouse name: Not on file  . Number of children: Not on file  . Years of education: Not on file  . Highest education level: Not on file  Occupational History  . Not on file  Tobacco Use  . Smoking status: Passive Smoke Exposure - Never Smoker  . Smokeless tobacco: Never Used  Substance and Sexual Activity  . Alcohol use: Yes    Alcohol/week: 2.0 standard drinks    Types: 2 Shots of liquor per week  . Drug use: No  . Sexual activity: Yes    Birth control/protection: None  Other Topics Concern  . Not on file  Social History Narrative  . Not on file   Social Determinants of Health   Financial Resource Strain: Not on file  Food Insecurity: Not on file  Transportation Needs: Not on file  Physical Activity: Not on file  Stress: Not on file  Social Connections: Not on file  Intimate Partner Violence: Not on file    Past Medical History, Surgical history, Social history, and Family history were reviewed and updated as appropriate.   Please see review of systems for further details on the patient's review from today.   Objective:   Physical Exam:  BP (!) 152/106   Pulse 86   Physical Exam Constitutional:      General: She is not in acute distress. Musculoskeletal:        General: No deformity.  Neurological:     Mental Status: She is alert and oriented to person, place, and time.     Coordination: Coordination normal.  Psychiatric:        Attention and Perception: Attention and perception normal. She does not perceive auditory or visual hallucinations.        Mood and Affect: Affect is not labile, blunt, angry or inappropriate.        Speech: Speech normal.        Behavior: Behavior normal.        Thought  Content: Thought content normal. Thought content is not paranoid or delusional. Thought content does not include homicidal or suicidal ideation. Thought content does not include homicidal or suicidal plan.        Cognition and Memory: Cognition and memory normal.        Judgment: Judgment normal.     Comments: Insight intact Mood is anxious and depressed in response to stressors     Lab Review:  No results found for: NA, K, CL, CO2, GLUCOSE, BUN, CREATININE, CALCIUM, PROT, ALBUMIN, AST, ALT, ALKPHOS, BILITOT, GFRNONAA, GFRAA  No results found for: WBC, RBC, HGB, HCT,  PLT, MCV, MCH, MCHC, RDW, LYMPHSABS, MONOABS, EOSABS, BASOSABS  No results found for: POCLITH, LITHIUM   No results found for: PHENYTOIN, PHENOBARB, VALPROATE, CBMZ   .res Assessment: Plan:   Will continue current plan of care since target signs and symptoms are well controlled without any tolerability issues. She reports that she is unclear about benefit of Guanfacine but would like to continue it QHS only until next visit since BID dosing caused drowsiness and fatigue.  Continue Buspar 15 mg po BID for anxiety. Continue Lamictal 150 mg po qd for mood s/s.  Continue Ritalin 20 mg 1/2-1 tab po BID for ADHD and shift work sleep d/o. Discussed considering switching to Provigil in the future since she reports that this worked well in the past but became cost-prohibitive. Continue Seroquel 100 mg 2-3 tabs po QHS for mood stabilization and insomnia.  Discussed possible referrals for couple's therapy. Recommend continuing therapy with Elio Forget, [facility]  Pt to follow-up with this provider in 3 months or sooner if clinically indicated.  Patient advised to contact office with any questions, adverse effects, or acute worsening in signs and symptoms.  Lilli was seen today for anxiety.  Diagnoses and all orders for this visit:  Attention deficit disorder (ADD) without hyperactivity -     methylphenidate (RITALIN) 20 MG  tablet; Take 1/2-1 po BID -     methylphenidate (RITALIN) 20 MG tablet; Take 1/2-1 po BID -     methylphenidate (RITALIN) 20 MG tablet; Take 1/2-1 po BID -     guanFACINE (TENEX) 1 MG tablet; TAKE 1 TABLET TWICE DAILY  Social anxiety disorder -     busPIRone (BUSPAR) 15 MG tablet; Take 1 tablet (15 mg total) by mouth 2 (two) times daily. -     propranolol (INDERAL) 10 MG tablet; TAKE 1 TO 2 TABLETS BY MOUTH TWICE A DAY AS NEEDED FOR ANXIETY  Bipolar II disorder (HCC) -     lamoTRIgine (LAMICTAL) 150 MG tablet; Take 1 tablet (150 mg total) by mouth daily. -     QUEtiapine (SEROQUEL) 100 MG tablet; TAKE 2-3 TABLETS BY MOUTH AT BEDTIME     Please see After Visit Summary for patient specific instructions.  Future Appointments  Date Time Provider Department Center  08/10/2020  9:00 AM Waldron Session, [facility] None  10/29/2020  9:00 AM Corie Chiquito, [facility] None    No orders of the defined types were placed in this encounter.   -------------------------------

## 2020-07-30 NOTE — Progress Notes (Deleted)
Jessica Suarez [id number] 04/26/1981 40 y.o.  Virtual Visit via Video Note  I connected with pt @ on 07/30/20 at  9:15 AM EDT by a video enabled telemedicine application and verified that I am speaking with the correct person using two identifiers.   I discussed the limitations of evaluation and management by telemedicine and the availability of in person appointments. The patient expressed understanding and agreed to proceed.  I discussed the assessment and treatment plan with the patient. The patient was provided an opportunity to ask questions and all were answered. The patient agreed with the plan and demonstrated an understanding of the instructions.   The patient was advised to call back or seek an in-person evaluation if the symptoms worsen or if the condition fails to improve as anticipated.  I provided *** minutes of non-face-to-face time during this encounter.  The patient was located at home.  The provider was located at [facility].   Corie Chiquito, PMHNP   Subjective:   Patient ID:  Jessica Suarez is a 40 y.o. (DOB 05-08-1980) female.  Chief Complaint: No chief complaint on file.   HPI ARYNN ARMAND presents for follow-up of ***   Past Medication Trials: Latuda-"did not like how it made me feel" Seroquel Wellbutrin-ineffective Zoloft Effexor Prozac-helpful for PMDD signs and symptoms Lexapro Provigil-not as helpful for daytime somnolence Methylphenidate BuSpar-helpful for anxiety Lamictal-helpful for mood Propranolol   Review of Systems:  Review of Systems  Constitutional: Negative.   HENT: Negative.   Eyes: Negative.   Respiratory: Negative.   Cardiovascular: Negative.   Gastrointestinal: Negative.   Endocrine: Negative.   Genitourinary: Negative.   Musculoskeletal: Negative.  Negative for gait problem.  Skin: Negative.   Allergic/Immunologic: Negative.   Neurological: Positive for weakness. Negative for tremors.  Hematological:  Negative.   Psychiatric/Behavioral:       Please refer to HPI    Medications: {medication reviewed/display:3041432}  Current Outpatient Medications  Medication Sig Dispense Refill  . APPLE CIDER VINEGAR PO Take by mouth.    . busPIRone (BUSPAR) 15 MG tablet Take 1 tablet (15 mg total) by mouth 2 (two) times daily. 180 tablet 1  . EPINEPHrine (EPIPEN 2-PAK) 0.3 mg/0.3 mL IJ SOAJ injection Inject into the muscle as needed (INJECTION PROTOCOL).    Marland Kitchen FLUoxetine (PROZAC) 20 MG capsule TAKE 1 CAPSULE BY MOUTH DAILY 1-2 WEEKS PRIOR TO MENSES (Patient not taking: Reported on 08/08/2019) 90 capsule 0  . guanFACINE (TENEX) 1 MG tablet TAKE 1 TABLET TWICE DAILY 180 tablet 0  . ibuprofen (ADVIL,MOTRIN) 600 MG tablet TAKE 1 TABLET EVERY 6 TO 8 HOURS AS NEEDED FOR PAIN  0  . lamoTRIgine (LAMICTAL) 150 MG tablet Take 1 tablet (150 mg total) by mouth daily. 90 tablet 1  . levocetirizine (XYZAL) 5 MG tablet Take 5 mg by mouth every evening.    . methylphenidate (RITALIN) 20 MG tablet Take 1/2-1 po BID 60 tablet 0  . methylphenidate (RITALIN) 20 MG tablet Take 1/2-1 po BID 60 tablet 0  . methylphenidate (RITALIN) 20 MG tablet Take 1/2-1 po BID 60 tablet 0  . propranolol (INDERAL) 10 MG tablet TAKE 1 TO 2 TABLETS BY MOUTH TWICE A DAY AS NEEDED FOR ANXIETY 360 tablet 1  . QUEtiapine (SEROQUEL) 100 MG tablet TAKE 2-3 TABLETS BY MOUTH AT BEDTIME 270 tablet 1   No current facility-administered medications for this visit.    Medication Side Effects: {Medication Side Effects (Optional):21014029}  Allergies: No Known Allergies  Past  Medical History:  Diagnosis Date  . Allergic rhinitis   . Depression   . History of Hodgkin's lymphoma MAY 2004  . IBS (irritable bowel syndrome)   . Migraines   . Sleep disorder     Family History  Problem Relation Age of Onset  . Hypertension Mother   . Hypercholesterolemia Mother   . Asthma Father   . Diabetes Father   . Asthma Brother   . Bipolar disorder Brother    . Suicidality Brother   . Drug abuse Paternal Aunt   . Aneurysm Paternal Aunt   . Anxiety disorder Other   . Cancer Paternal Aunt     Social History   Socioeconomic History  . Marital status: Single    Spouse name: Not on file  . Number of children: Not on file  . Years of education: Not on file  . Highest education level: Not on file  Occupational History  . Not on file  Tobacco Use  . Smoking status: Passive Smoke Exposure - Never Smoker  . Smokeless tobacco: Never Used  Substance and Sexual Activity  . Alcohol use: Yes    Alcohol/week: 2.0 standard drinks    Types: 2 Shots of liquor per week  . Drug use: No  . Sexual activity: Yes    Birth control/protection: None  Other Topics Concern  . Not on file  Social History Narrative  . Not on file   Social Determinants of Health   Financial Resource Strain: Not on file  Food Insecurity: Not on file  Transportation Needs: Not on file  Physical Activity: Not on file  Stress: Not on file  Social Connections: Not on file  Intimate Partner Violence: Not on file    Past Medical History, Surgical history, Social history, and Family history were reviewed and updated as appropriate.   Please see review of systems for further details on the patient's review from today.   Objective:   Physical Exam:  There were no vitals taken for this visit.  Physical Exam Neurological:     Mental Status: She is alert and oriented to person, place, and time.     Cranial Nerves: No dysarthria.  Psychiatric:        Attention and Perception: Attention and perception normal.        Mood and Affect: Mood is anxious and depressed.        Speech: Speech normal.        Behavior: Behavior is cooperative.        Thought Content: Thought content normal. Thought content is not paranoid or delusional. Thought content does not include homicidal or suicidal ideation. Thought content does not include homicidal or suicidal plan.        Cognition and  Memory: Cognition and memory normal.        Judgment: Judgment normal.     Comments: Insight intact     Lab Review:  No results found for: NA, K, CL, CO2, GLUCOSE, BUN, CREATININE, CALCIUM, PROT, ALBUMIN, AST, ALT, ALKPHOS, BILITOT, GFRNONAA, GFRAA  No results found for: WBC, RBC, HGB, HCT, PLT, MCV, MCH, MCHC, RDW, LYMPHSABS, MONOABS, EOSABS, BASOSABS  No results found for: POCLITH, LITHIUM   No results found for: PHENYTOIN, PHENOBARB, VALPROATE, CBMZ   .res Assessment: Plan:    There are no diagnoses linked to this encounter.   Please see After Visit Summary for patient specific instructions.  Future Appointments  Date Time Provider Department Center  07/30/2020  9:15 AM Corie Chiquito,  PMHNP [facility] None  08/10/2020  9:00 AM Waldron Session, [facility] None    No orders of the defined types were placed in this encounter.     -------------------------------

## 2020-08-10 ENCOUNTER — Ambulatory Visit: Payer: 59 | Admitting: Mental Health

## 2020-10-08 DIAGNOSIS — H20012 Primary iridocyclitis, left eye: Secondary | ICD-10-CM | POA: Diagnosis not present

## 2020-10-12 DIAGNOSIS — H20012 Primary iridocyclitis, left eye: Secondary | ICD-10-CM | POA: Diagnosis not present

## 2020-10-29 ENCOUNTER — Ambulatory Visit: Payer: 59 | Admitting: Psychiatry

## 2020-11-20 ENCOUNTER — Other Ambulatory Visit: Payer: Self-pay

## 2020-11-20 ENCOUNTER — Telehealth: Payer: Self-pay | Admitting: Psychiatry

## 2020-11-20 DIAGNOSIS — F988 Other specified behavioral and emotional disorders with onset usually occurring in childhood and adolescence: Secondary | ICD-10-CM

## 2020-11-20 MED ORDER — METHYLPHENIDATE HCL 20 MG PO TABS: ORAL_TABLET | ORAL | 0 refills | Status: DC

## 2020-11-20 NOTE — Telephone Encounter (Signed)
Pended.

## 2020-11-20 NOTE — Telephone Encounter (Signed)
Pt would like a refill on Methylphenidate 20mg . Please send to on [address]

## 2020-12-04 ENCOUNTER — Ambulatory Visit: Payer: 59 | Admitting: Psychiatry

## 2020-12-07 ENCOUNTER — Other Ambulatory Visit: Payer: Self-pay

## 2020-12-07 ENCOUNTER — Ambulatory Visit (INDEPENDENT_AMBULATORY_CARE_PROVIDER_SITE_OTHER): Payer: 59 | Admitting: Psychiatry

## 2020-12-07 ENCOUNTER — Encounter: Payer: Self-pay | Admitting: Psychiatry

## 2020-12-07 DIAGNOSIS — F3181 Bipolar II disorder: Secondary | ICD-10-CM | POA: Diagnosis not present

## 2020-12-07 DIAGNOSIS — F988 Other specified behavioral and emotional disorders with onset usually occurring in childhood and adolescence: Secondary | ICD-10-CM

## 2020-12-07 DIAGNOSIS — R69 Illness, unspecified: Secondary | ICD-10-CM | POA: Diagnosis not present

## 2020-12-07 DIAGNOSIS — F4010 Social phobia, unspecified: Secondary | ICD-10-CM | POA: Diagnosis not present

## 2020-12-07 MED ORDER — QUETIAPINE FUMARATE 100 MG PO TABS: ORAL_TABLET | ORAL | 1 refills | Status: DC

## 2020-12-07 MED ORDER — BUSPIRONE HCL 15 MG PO TABS
15.0000 mg | ORAL_TABLET | Freq: Two times a day (BID) | ORAL | 1 refills | Status: DC
Start: 2020-12-07 — End: 2021-03-19

## 2020-12-07 MED ORDER — PROPRANOLOL HCL 10 MG PO TABS: ORAL_TABLET | ORAL | 1 refills | Status: DC

## 2020-12-07 MED ORDER — METHYLPHENIDATE HCL 20 MG PO TABS
ORAL_TABLET | ORAL | 0 refills | Status: DC
Start: 2021-02-16 — End: 2021-03-19

## 2020-12-07 MED ORDER — METHYLPHENIDATE HCL 20 MG PO TABS: ORAL_TABLET | ORAL | 0 refills | Status: DC

## 2020-12-07 MED ORDER — LAMOTRIGINE 100 MG PO TABS: ORAL_TABLET | ORAL | 1 refills | Status: DC

## 2020-12-07 MED ORDER — LAMOTRIGINE 25 MG PO TABS: ORAL_TABLET | ORAL | 0 refills | Status: DC

## 2020-12-07 NOTE — Progress Notes (Signed)
Jessica Suarez [id number] Nov 09, 1980 40 y.o.  Subjective:   Patient ID:  Jessica Suarez is a 40 y.o. (DOB Feb 24, 1981) female.  Chief Complaint:  Chief Complaint  Patient presents with   Anxiety   ADHD   Other    H/o mood disturbance    Anxiety Patient reports no palpitations.    Jessica Suarez presents to the office today for follow-up of anxiety, mood disturbance, insomnia, and ADHD. She reports that she has been losing weight. Reports appetite is low and on weekends she "choosing sleeping over eating." Reports that she has been trying to eat healthier foods. Reports that she had some diarrhea and nausea and felt better after resting a day and thinks this could have been caused by stress and decreased sleep. Describes mood as "up and down." Reports that she occasionally will get overwhelmed with having to interact with 30 direct reports. Denies persistent depression. She reports that she has not taken Lamictal in an extended period of time (>2 months) after inadvertently not taking it. "I probably should be back on it." She reports some irritability and this may be increased without Lamictal. She reports energy is lower than usual. Motivation has been lower and has not been cleaning like she normally would. Difficulty concentrating due to managing multiple tasks. Some difficulty deciding where to start with tasks. Denies SI.   She reports increased boss stress and responsibility. She reports that her supervisor will be stepping down and reports that he has caused a significant amount of her stress. Has been with her current company for 15 years. Reports that survey scores at work improved.   Mother-in-law was dx'd with breast cancer and they have been visiting her on weekends. Jessica Suarez's father was dx'd with Stage II Prostate CA.   Reports that ETOH is more during the week than on the weekends.   Ritalin last filled 11/24/20.   Past Medication Trials: Latuda- "did not like how it made  me feel" Seroquel Wellbutrin-ineffective Zoloft Effexor Prozac-helpful for PMDD signs and symptoms Lexapro Provigil-not as helpful for daytime somnolence Methylphenidate BuSpar-helpful for anxiety Lamictal-helpful for mood Propranolol Guanfacine-ineffective  AIMS    Flowsheet Row Office Visit from 12/10/2019 in [facility]  AIMS Total Score 0        Review of Systems:  Review of Systems  Cardiovascular:  Negative for palpitations.  Musculoskeletal:  Negative for gait problem.  Neurological:  Negative for tremors.  Psychiatric/Behavioral:         Please refer to HPI   Medications: I have reviewed the patient's current medications.  Current Outpatient Medications  Medication Sig Dispense Refill   lamoTRIgine (LAMICTAL) 100 MG tablet Take 1 tab po qd x 2 weeks, then increase to 1.5 tabs po qd 45 tablet 1   lamoTRIgine (LAMICTAL) 25 MG tablet Take 1 tablet (25 mg total) by mouth daily for 14 days, THEN 2 tablets (50 mg total) daily for 14 days. 45 tablet 0   busPIRone (BUSPAR) 15 MG tablet Take 1 tablet (15 mg total) by mouth 2 (two) times daily. 180 tablet 1   EPINEPHrine (EPIPEN 2-PAK) 0.3 mg/0.3 mL IJ SOAJ injection Inject into the muscle as needed (INJECTION PROTOCOL).     ibuprofen (ADVIL,MOTRIN) 600 MG tablet TAKE 1 TABLET EVERY 6 TO 8 HOURS AS NEEDED FOR PAIN  0   levocetirizine (XYZAL) 5 MG tablet Take 5 mg by mouth every evening.     [START ON 02/16/2021] methylphenidate (RITALIN) 20 MG tablet Take  1/2-1 po BID 60 tablet 0   [START ON 01/19/2021] methylphenidate (RITALIN) 20 MG tablet Take 1/2-1 po BID 60 tablet 0   [START ON 12/22/2020] methylphenidate (RITALIN) 20 MG tablet Take 1/2-1 po BID 60 tablet 0   propranolol (INDERAL) 10 MG tablet TAKE 1 TO 2 TABLETS BY MOUTH TWICE A DAY AS NEEDED FOR ANXIETY 360 tablet 1   QUEtiapine (SEROQUEL) 100 MG tablet TAKE 2-3 TABLETS BY MOUTH AT BEDTIME 270 tablet 1   No current facility-administered medications for  this visit.    Medication Side Effects: None  Allergies: No Known Allergies  Past Medical History:  Diagnosis Date   Allergic rhinitis    Depression    History of Hodgkin's lymphoma MAY 2004   IBS (irritable bowel syndrome)    Migraines    Sleep disorder     Past Medical History, Surgical history, Social history, and Family history were reviewed and updated as appropriate.   Please see review of systems for further details on the patient's review from today.   Objective:   Physical Exam:  BP 121/86   Pulse 98   Wt 145 lb (65.8 kg)   Physical Exam Constitutional:      General: She is not in acute distress. Musculoskeletal:        General: No deformity.  Neurological:     Mental Status: She is alert and oriented to person, place, and time.     Coordination: Coordination normal.  Psychiatric:        Attention and Perception: Attention and perception normal. She does not perceive auditory or visual hallucinations.        Mood and Affect: Mood is anxious. Mood is not depressed. Affect is not labile, blunt, angry or inappropriate.        Speech: Speech normal.        Behavior: Behavior normal.        Thought Content: Thought content normal. Thought content is not paranoid or delusional. Thought content does not include homicidal or suicidal ideation. Thought content does not include homicidal or suicidal plan.        Cognition and Memory: Cognition and memory normal.        Judgment: Judgment normal.     Comments: Insight intact    Lab Review:  No results found for: NA, K, CL, CO2, GLUCOSE, BUN, CREATININE, CALCIUM, PROT, ALBUMIN, AST, ALT, ALKPHOS, BILITOT, GFRNONAA, GFRAA  No results found for: WBC, RBC, HGB, HCT, PLT, MCV, MCH, MCHC, RDW, LYMPHSABS, MONOABS, EOSABS, BASOSABS  No results found for: POCLITH, LITHIUM   No results found for: PHENYTOIN, PHENOBARB, VALPROATE, CBMZ   .res Assessment: Plan:    Patient seen for 30 minutes and time spent counseling  patient regarding lamotrigine.  She reports that lamotrigine seemed to be helpful for irritability and mood signs and symptoms and she would like to restart lamotrigine.  Discussed restarting lamotrigine at low dose and gradually increasing to minimize risk of rash.  Advised patient to monitor for rash and to contact office immediately if she develops a rash.  Will start Lamictal 25 mg daily for 2 weeks, then increase to 50 mg daily for 2 weeks, then increase to 100 mg daily for 2 weeks, then increase to 150 mg daily.  Discussed that 2 scripts will be sent for lamotrigine-1 prescription for the 25 mg tablets to start titration and another prescription for the 100 mg tablets to be put on file. Continue Ritalin 20 mg 1/2 to 1 tablet  twice daily for attention deficit disorder. Continue Seroquel 200 to 300 mg at bedtime for mood stabilization and insomnia. Continue BuSpar 15 mg twice daily for anxiety. Continue propranolol as needed for anxiety. Patient to follow-up with this provider in 3 months or sooner if clinically indicated. She reports that she has seen Elio Forget, [facility] for therapy.  She reports that she is unable to see him more often due to schedule constraints and will consider restarting therapy as needed. Patient advised to contact office with any questions, adverse effects, or acute worsening in signs and symptoms.   Makynlee was seen today for anxiety, adhd and other.  Diagnoses and all orders for this visit:  Attention deficit disorder (ADD) without hyperactivity -     methylphenidate (RITALIN) 20 MG tablet; Take 1/2-1 po BID -     methylphenidate (RITALIN) 20 MG tablet; Take 1/2-1 po BID -     methylphenidate (RITALIN) 20 MG tablet; Take 1/2-1 po BID  Social anxiety disorder -     busPIRone (BUSPAR) 15 MG tablet; Take 1 tablet (15 mg total) by mouth 2 (two) times daily. -     propranolol (INDERAL) 10 MG tablet; TAKE 1 TO 2 TABLETS BY MOUTH TWICE A DAY AS NEEDED FOR  ANXIETY  Bipolar II disorder (HCC) -     lamoTRIgine (LAMICTAL) 25 MG tablet; Take 1 tablet (25 mg total) by mouth daily for 14 days, THEN 2 tablets (50 mg total) daily for 14 days. -     lamoTRIgine (LAMICTAL) 100 MG tablet; Take 1 tab po qd x 2 weeks, then increase to 1.5 tabs po qd -     QUEtiapine (SEROQUEL) 100 MG tablet; TAKE 2-3 TABLETS BY MOUTH AT BEDTIME    Please see After Visit Summary for patient specific instructions.  Future Appointments  Date Time Provider Department Center  03/09/2021  8:30 AM Corie Chiquito, PMHNP [facility] None    No orders of the defined types were placed in this encounter.   -------------------------------

## 2020-12-19 ENCOUNTER — Other Ambulatory Visit: Payer: Self-pay | Admitting: Psychiatry

## 2020-12-19 DIAGNOSIS — F3181 Bipolar II disorder: Secondary | ICD-10-CM

## 2021-01-08 ENCOUNTER — Other Ambulatory Visit: Payer: Self-pay | Admitting: Psychiatry

## 2021-01-08 DIAGNOSIS — F3181 Bipolar II disorder: Secondary | ICD-10-CM

## 2021-03-09 ENCOUNTER — Ambulatory Visit: Payer: 59 | Admitting: Psychiatry

## 2021-03-19 ENCOUNTER — Ambulatory Visit (INDEPENDENT_AMBULATORY_CARE_PROVIDER_SITE_OTHER): Payer: 59 | Admitting: Psychiatry

## 2021-03-19 ENCOUNTER — Encounter: Payer: Self-pay | Admitting: Psychiatry

## 2021-03-19 DIAGNOSIS — F3181 Bipolar II disorder: Secondary | ICD-10-CM | POA: Diagnosis not present

## 2021-03-19 DIAGNOSIS — F988 Other specified behavioral and emotional disorders with onset usually occurring in childhood and adolescence: Secondary | ICD-10-CM

## 2021-03-19 DIAGNOSIS — F4010 Social phobia, unspecified: Secondary | ICD-10-CM | POA: Diagnosis not present

## 2021-03-19 DIAGNOSIS — R69 Illness, unspecified: Secondary | ICD-10-CM | POA: Diagnosis not present

## 2021-03-19 MED ORDER — METHYLPHENIDATE HCL 20 MG PO TABS: ORAL_TABLET | ORAL | 0 refills | Status: DC

## 2021-03-19 MED ORDER — QUETIAPINE FUMARATE 100 MG PO TABS: ORAL_TABLET | ORAL | 1 refills | Status: DC

## 2021-03-19 MED ORDER — LAMOTRIGINE 150 MG PO TABS: 150.0000 mg | ORAL_TABLET | Freq: Every day | ORAL | 1 refills | Status: DC

## 2021-03-19 MED ORDER — BUSPIRONE HCL 15 MG PO TABS: 15.0000 mg | ORAL_TABLET | Freq: Two times a day (BID) | ORAL | 1 refills | Status: DC

## 2021-03-19 MED ORDER — PROPRANOLOL HCL 10 MG PO TABS: ORAL_TABLET | ORAL | 1 refills | Status: DC

## 2021-03-19 NOTE — Progress Notes (Signed)
Jessica Suarez [id number] 1981-01-09 40 y.o.  Virtual Visit via Telephone Note  I connected with pt on 03/19/21 at  8:30 AM EST by telephone and verified that I am speaking with the correct person using two identifiers.   I discussed the limitations, risks, security and privacy concerns of performing an evaluation and management service by telephone and the availability of in person appointments. I also discussed with the patient that there may be a patient responsible charge related to this service. The patient expressed understanding and agreed to proceed.   I discussed the assessment and treatment plan with the patient. The patient was provided an opportunity to ask questions and all were answered. The patient agreed with the plan and demonstrated an understanding of the instructions.   The patient was advised to call back or seek an in-person evaluation if the symptoms worsen or if the condition fails to improve as anticipated.  I provided 30 minutes of non-face-to-face time during this encounter.  The patient was located at home.  The provider was located at [facility].   Corie Chiquito, PMHNP   Subjective:   Patient ID:  Jessica Suarez is a 40 y.o. (DOB 19-Apr-1981) female.  Chief Complaint:  Chief Complaint  Patient presents with   Follow-up    Anxiety, mood disturbance, ADHD, and sleep disturbance    HPI Jessica Suarez presents for follow-up of anxiety, mood disturbance, ADHD, and sleep disturbance. She reports that she has been "pretty good lately because I am on my 4 weeks vacation." She reports that she has been doing some reflection and noticing improved energy and feeling "so free." She took a defense class last night and today is going to a kickboxing class. She has been cooking daily and enjoying it. She reports that she is thinking about how she can have a better work/life balance and was previously working 10-12 hours a day and "it consumed me." Reports  that they were short-staffed for a few months and was working more. Reports newly hired employees are validating some of her experiences and perceptions. She recognizes that she "takes pride" in her work and wants it done to a certain standard. She reports that she has decided that she will no longer tolerate someone being difficult in the workplace. Concentration is adequate. Denies impulsive or risky behavior. Denies SI.   Reports that her sleep is still a little "off." She reports that she has had dreams about work and when she tries to sleep she feels like she is supposed to be doing. Sleep has improved some. Mood and anxiety improved while on vacation.  Reports that it is more difficult taking medication consistently while on vacation.   Review of Systems:  Review of Systems  Gastrointestinal: Negative.   Musculoskeletal:  Negative for gait problem.  Neurological:  Negative for tremors.  Psychiatric/Behavioral:         Please refer to HPI   Medications: I have reviewed the patient's current medications.  Current Outpatient Medications  Medication Sig Dispense Refill   ibuprofen (ADVIL,MOTRIN) 600 MG tablet TAKE 1 TABLET EVERY 6 TO 8 HOURS AS NEEDED FOR PAIN  0   levocetirizine (XYZAL) 5 MG tablet Take 5 mg by mouth every evening.     busPIRone (BUSPAR) 15 MG tablet Take 1 tablet (15 mg total) by mouth 2 (two) times daily. 180 tablet 1   EPINEPHrine (EPIPEN 2-PAK) 0.3 mg/0.3 mL IJ SOAJ injection Inject into the muscle as needed (INJECTION PROTOCOL).  lamoTRIgine (LAMICTAL) 150 MG tablet Take 1 tablet (150 mg total) by mouth daily. 90 tablet 1   [START ON 05/20/2021] methylphenidate (RITALIN) 20 MG tablet Take 1/2-1 po BID 60 tablet 0   [START ON 04/22/2021] methylphenidate (RITALIN) 20 MG tablet Take 1/2-1 po BID 60 tablet 0   [START ON 03/25/2021] methylphenidate (RITALIN) 20 MG tablet Take 1/2-1 po BID 60 tablet 0   propranolol (INDERAL) 10 MG tablet TAKE 1 TO 2 TABLETS BY MOUTH  TWICE A DAY AS NEEDED FOR ANXIETY 360 tablet 1   QUEtiapine (SEROQUEL) 100 MG tablet TAKE 2-3 TABLETS BY MOUTH AT BEDTIME 270 tablet 1   No current facility-administered medications for this visit.    Medication Side Effects: None  Allergies: No Known Allergies  Past Medical History:  Diagnosis Date   Allergic rhinitis    Depression    History of Hodgkin's lymphoma MAY 2004   IBS (irritable bowel syndrome)    Migraines    Sleep disorder     Family History  Problem Relation Age of Onset   Hypertension Mother    Hypercholesterolemia Mother    Asthma Father    Diabetes Father    Asthma Brother    Bipolar disorder Brother    Suicidality Brother    Drug abuse Paternal Aunt    Aneurysm Paternal Aunt    Anxiety disorder Other    Cancer Paternal Aunt     Social History   Socioeconomic History   Marital status: Single    Spouse name: Not on file   Number of children: Not on file   Years of education: Not on file   Highest education level: Not on file  Occupational History   Not on file  Tobacco Use   Smoking status: Passive Smoke Exposure - Never Smoker   Smokeless tobacco: Never  Substance and Sexual Activity   Alcohol use: Yes    Alcohol/week: 2.0 standard drinks    Types: 2 Shots of liquor per week   Drug use: No   Sexual activity: Yes    Birth control/protection: None  Other Topics Concern   Not on file  Social History Narrative   Not on file   Social Determinants of Health   Financial Resource Strain: Not on file  Food Insecurity: Not on file  Transportation Needs: Not on file  Physical Activity: Not on file  Stress: Not on file  Social Connections: Not on file  Intimate Partner Violence: Not on file    Past Medical History, Surgical history, Social history, and Family history were reviewed and updated as appropriate.   Please see review of systems for further details on the patient's review from today.   Objective:   Physical Exam:  There  were no vitals taken for this visit.  Physical Exam Neurological:     Mental Status: She is alert and oriented to person, place, and time.     Cranial Nerves: No dysarthria.  Psychiatric:        Attention and Perception: Attention and perception normal.        Mood and Affect: Mood normal.        Speech: Speech normal.        Behavior: Behavior is cooperative.        Thought Content: Thought content normal. Thought content is not paranoid or delusional. Thought content does not include homicidal or suicidal ideation. Thought content does not include homicidal or suicidal plan.        Cognition  and Memory: Cognition and memory normal.        Judgment: Judgment normal.     Comments: Insight intact    Lab Review:  No results found for: NA, K, CL, CO2, GLUCOSE, BUN, CREATININE, CALCIUM, PROT, ALBUMIN, AST, ALT, ALKPHOS, BILITOT, GFRNONAA, GFRAA  No results found for: WBC, RBC, HGB, HCT, PLT, MCV, MCH, MCHC, RDW, LYMPHSABS, MONOABS, EOSABS, BASOSABS  No results found for: POCLITH, LITHIUM   No results found for: PHENYTOIN, PHENOBARB, VALPROATE, CBMZ   .res Assessment: Plan:   Pt seen for 30 minutes and time spent discussing improvement in mood and anxiety s/s since she has been on an extended vacation from work. Discussed strategies to try to maintain healthy work/life balance after vacation to include setting boundaries around work hours, delegating when possible, and confronting someone's disruptive behaviors. Discussed trying to maintain self-care and doing activities outside of work that she enjoys.  Will continue current medications since she reports that mood and anxiety s/s are well-controlled at this time.  Continue Seroquel 200-300 mg at bedtime for mood s/s and insomnia.  Continue Buspar 15 mg po BID for anxiety.  Continue Propranolol 10 mg 1-2 tabs po BID prn anxiety.  Continue Ritalin 10 mg 1/2-1 tab po BID for ADHD.  Continue Lamictal 150 mg po qd for mood stabilization.   Pt to follow-up in 3 months or sooner if clinically indicated.  Patient advised to contact office with any questions, adverse effects, or acute worsening in signs and symptoms.   Jessica Suarez was seen today for follow-up.  Diagnoses and all orders for this visit:  Attention deficit disorder (ADD) without hyperactivity -     methylphenidate (RITALIN) 20 MG tablet; Take 1/2-1 po BID -     methylphenidate (RITALIN) 20 MG tablet; Take 1/2-1 po BID -     methylphenidate (RITALIN) 20 MG tablet; Take 1/2-1 po BID  Social anxiety disorder -     busPIRone (BUSPAR) 15 MG tablet; Take 1 tablet (15 mg total) by mouth 2 (two) times daily. -     propranolol (INDERAL) 10 MG tablet; TAKE 1 TO 2 TABLETS BY MOUTH TWICE A DAY AS NEEDED FOR ANXIETY  Bipolar II disorder (HCC) -     lamoTRIgine (LAMICTAL) 150 MG tablet; Take 1 tablet (150 mg total) by mouth daily. -     QUEtiapine (SEROQUEL) 100 MG tablet; TAKE 2-3 TABLETS BY MOUTH AT BEDTIME   Please see After Visit Summary for patient specific instructions.  No future appointments.   No orders of the defined types were placed in this encounter.     -------------------------------

## 2021-05-11 ENCOUNTER — Other Ambulatory Visit: Payer: Self-pay | Admitting: Psychiatry

## 2021-05-11 DIAGNOSIS — F3181 Bipolar II disorder: Secondary | ICD-10-CM

## 2021-06-18 ENCOUNTER — Encounter: Payer: Self-pay | Admitting: Psychiatry

## 2021-06-18 ENCOUNTER — Ambulatory Visit (INDEPENDENT_AMBULATORY_CARE_PROVIDER_SITE_OTHER): Payer: 59 | Admitting: Psychiatry

## 2021-06-18 DIAGNOSIS — F988 Other specified behavioral and emotional disorders with onset usually occurring in childhood and adolescence: Secondary | ICD-10-CM | POA: Diagnosis not present

## 2021-06-18 DIAGNOSIS — F4010 Social phobia, unspecified: Secondary | ICD-10-CM

## 2021-06-18 DIAGNOSIS — R69 Illness, unspecified: Secondary | ICD-10-CM | POA: Diagnosis not present

## 2021-06-18 DIAGNOSIS — F3181 Bipolar II disorder: Secondary | ICD-10-CM | POA: Diagnosis not present

## 2021-06-18 MED ORDER — METHYLPHENIDATE HCL 20 MG PO TABS: ORAL_TABLET | ORAL | 0 refills | Status: DC

## 2021-06-18 MED ORDER — LAMOTRIGINE 150 MG PO TABS: 150.0000 mg | ORAL_TABLET | Freq: Every day | ORAL | 1 refills | Status: DC

## 2021-06-18 MED ORDER — BUSPIRONE HCL 15 MG PO TABS: 15.0000 mg | ORAL_TABLET | Freq: Two times a day (BID) | ORAL | 1 refills | Status: DC

## 2021-06-18 MED ORDER — QUETIAPINE FUMARATE 100 MG PO TABS: ORAL_TABLET | ORAL | 1 refills | Status: DC

## 2021-06-18 NOTE — Progress Notes (Signed)
Jessica Suarez [id number] 1980/05/05 41 y.o.  Subjective:   Patient ID:  Jessica Suarez is a 41 y.o. (DOB January 24, 1981) female.  Chief Complaint:  Chief Complaint  Patient presents with   Follow-up    Mood disturbance, anxiety, ADHD, and insomnia    HPI Jessica Suarez presents to the office today for follow-up of anxiety, Bipolar D/O, ADHD, and insomnia. Has recently had severe shoulder pain and this has interrupted her sleep. She reports that it may have started after she was lifting overhead. She would like to see PCP and may be considered a new patient. She reports feeling "tired all the time." She reports anxiety has not been as bad as it has been at times in the past. Denies panic attacks. She reports some anxiety with being around people and crowds. She reports that she tries "not to over-think" interpersonal interactions beforehand. Recently spoke infront of a group and did well when she did not think about it beforehand and then had anxiety the next day with talking before the group after thinking about  it. She reports anxiety is improved with lamictal.   She reports that her mood is "ok." She reports occ "depressed moods but I don't think it is really bad." She reports depressed moods are usually short in duration, ie. Sometimes when starting her work day. Denies manic s/s. Denies irritability. Denies impulsive behavior or increased spending. Appetite has been ok.  She reports that for the past 2-3 weeks she has not been able to find anything she enjoys eating, to include food she has enjoyed in the past, and sometimes this causes her not to eat. Tries not to eat heavy foods at work since it makes her tired. Motivation has been ok. She reports that she tries to clean certain days and has been maintaining things more. She reports that concentration is challenging at times due to multiple responsibilities and interruptions. She will try to work some from home to minimize distractions. Denies  SI.   She continues to work very long hours due to high volume at work. Usually working 10-12 hour days, sometimes 6 days a week. She reports that she was hopeful that a change in supervisors would help her have more reasonable hours.   Father completed treatments for Prostate CA in mid-December and is doing ok. She reports that she and her husband are doing "ok." She reports that she has more ETOH during the week than on weekends and "using it as a stress reliever."   Looking forward to beach trip next month.   Past Medication Trials: Latuda- "did not like how it made me feel" Seroquel Wellbutrin-ineffective Zoloft Effexor Prozac-helpful for PMDD signs and symptoms Lexapro Provigil-not as helpful for daytime somnolence Methylphenidate BuSpar-helpful for anxiety Lamictal-helpful for mood Propranolol Guanfacine-ineffective    AIMS    Flowsheet Row Office Visit from 06/18/2021 in [facility] Office Visit from 12/10/2019 in [facility]  AIMS Total Score 0 0        Review of Systems:  Review of Systems  Musculoskeletal:  Negative for gait problem.       Shoulder pain  Neurological:  Negative for tremors.  Psychiatric/Behavioral:         Please refer to HPI   Medications: I have reviewed the patient's current medications.  Current Outpatient Medications  Medication Sig Dispense Refill   acetaminophen (TYLENOL) 325 MG tablet Take 650 mg by mouth every 6 (six) hours as needed.  levocetirizine (XYZAL) 5 MG tablet Take 5 mg by mouth every evening.     lidocaine (LIDODERM) 5 % Place 1 patch onto the skin daily. Remove & Discard patch within 12 hours or as directed by MD     propranolol (INDERAL) 10 MG tablet TAKE 1 TO 2 TABLETS BY MOUTH TWICE A DAY AS NEEDED FOR ANXIETY 360 tablet 1   busPIRone (BUSPAR) 15 MG tablet Take 1 tablet (15 mg total) by mouth 2 (two) times daily. 180 tablet 1   EPINEPHrine (EPIPEN 2-PAK) 0.3 mg/0.3 mL IJ SOAJ  injection Inject into the muscle as needed (INJECTION PROTOCOL).     lamoTRIgine (LAMICTAL) 150 MG tablet Take 1 tablet (150 mg total) by mouth daily. 90 tablet 1   [START ON 08/18/2021] methylphenidate (RITALIN) 20 MG tablet Take 1/2-1 po BID 60 tablet 0   [START ON 07/21/2021] methylphenidate (RITALIN) 20 MG tablet Take 1/2-1 po BID 60 tablet 0   [START ON 06/23/2021] methylphenidate (RITALIN) 20 MG tablet Take 1/2-1 po BID 60 tablet 0   QUEtiapine (SEROQUEL) 100 MG tablet TAKE 2-3 TABLETS BY MOUTH AT BEDTIME 270 tablet 1   No current facility-administered medications for this visit.    Medication Side Effects: None  Allergies: No Known Allergies  Past Medical History:  Diagnosis Date   Allergic rhinitis    Depression    History of Hodgkin's lymphoma MAY 2004   IBS (irritable bowel syndrome)    Migraines    Sleep disorder     Past Medical History, Surgical history, Social history, and Family history were reviewed and updated as appropriate.   Please see review of systems for further details on the patient's review from today.   Objective:   Physical Exam:  BP (!) 125/91    Pulse 95   Physical Exam Constitutional:      General: She is not in acute distress. Musculoskeletal:        General: No deformity.  Neurological:     Mental Status: She is alert and oriented to person, place, and time.     Coordination: Coordination normal.  Psychiatric:        Attention and Perception: Attention and perception normal. She does not perceive auditory or visual hallucinations.        Mood and Affect: Mood is not depressed. Affect is not labile, blunt, angry or inappropriate.        Speech: Speech normal.        Behavior: Behavior normal.        Thought Content: Thought content normal. Thought content is not paranoid or delusional. Thought content does not include homicidal or suicidal ideation. Thought content does not include homicidal or suicidal plan.        Cognition and Memory:  Cognition and memory normal.        Judgment: Judgment normal.     Comments: Insight intact Mood is mildly anxious in response to work    Lab Review:  No results found for: NA, K, CL, CO2, GLUCOSE, BUN, CREATININE, CALCIUM, PROT, ALBUMIN, AST, ALT, ALKPHOS, BILITOT, GFRNONAA, GFRAA  No results found for: WBC, RBC, HGB, HCT, PLT, MCV, MCH, MCHC, RDW, LYMPHSABS, MONOABS, EOSABS, BASOSABS  No results found for: POCLITH, LITHIUM   No results found for: PHENYTOIN, PHENOBARB, VALPROATE, CBMZ   .res Assessment: Plan:    Will continue current plan of care since target signs and symptoms are well controlled without any tolerability issues. Continue Buspar 15 mg po BID for anxiety.  Continue Lamictal 150 mg po qd for mood s/s.  Continue Ritalin 20 mg 1/2-1 tab po BID for ADD.  Continue Seroquel 100 mg 2-3 tabs po QHS for Bipolar D/O and insomnia. Pt to follow-up in 3 months or sooner if clinically indicated.  Patient advised to contact office with any questions, adverse effects, or acute worsening in signs and symptoms.   Jessica Suarez was seen today for follow-up.  Diagnoses and all orders for this visit:  Attention deficit disorder (ADD) without hyperactivity -     methylphenidate (RITALIN) 20 MG tablet; Take 1/2-1 po BID -     methylphenidate (RITALIN) 20 MG tablet; Take 1/2-1 po BID -     methylphenidate (RITALIN) 20 MG tablet; Take 1/2-1 po BID  Social anxiety disorder -     busPIRone (BUSPAR) 15 MG tablet; Take 1 tablet (15 mg total) by mouth 2 (two) times daily.  Bipolar II disorder (HCC) -     lamoTRIgine (LAMICTAL) 150 MG tablet; Take 1 tablet (150 mg total) by mouth daily. -     QUEtiapine (SEROQUEL) 100 MG tablet; TAKE 2-3 TABLETS BY MOUTH AT BEDTIME     Please see After Visit Summary for patient specific instructions.  Future Appointments  Date Time Provider Department Center  09/15/2021  8:30 AM Corie Chiquito, PMHNP [facility] None     No orders of the defined types were  placed in this encounter.   -------------------------------

## 2021-08-16 ENCOUNTER — Ambulatory Visit (INDEPENDENT_AMBULATORY_CARE_PROVIDER_SITE_OTHER): Payer: 59 | Admitting: Mental Health

## 2021-08-16 DIAGNOSIS — F988 Other specified behavioral and emotional disorders with onset usually occurring in childhood and adolescence: Secondary | ICD-10-CM | POA: Diagnosis not present

## 2021-08-16 DIAGNOSIS — R69 Illness, unspecified: Secondary | ICD-10-CM | POA: Diagnosis not present

## 2021-08-16 NOTE — Progress Notes (Signed)
[facility] note ? ?Name: Jessica Suarez ?Date: 08/16/2021 ?MRN: [id number] ?DOB: April 27, 1981 ?PCP: Sigmund Hazel, MD ? ?Time spent: 50 minutes ? ?Treatment: Ind. therapy ? ?Mental Status Exam: ?  ? ?Appearance:   Casual     ?Behavior:  Appropriate  ?Motor:  Normal  ?Speech/Language:   Clear and Coherent  ?Affect:  Full range  ?Mood:  anxious  ?Thought process:  normal  ?Thought content:    WNL  ?Sensory/Perceptual disturbances:    none  ?Orientation:  x4  ?Attention:  good  ?Concentration:  Good  ?Memory:  WNL  ?Fund of knowledge:   Good  ?Insight:    Good  ?Judgment:   Good  ?Impulse Control:  Good  ? ?Reported Symptoms:  Anxiety, some sad feelings  ? ?Risk Assessment: ?Danger to Self:  No ?Self-injurious Behavior: No ?Danger to Others: No ?Duty to Warn:no ?Physical Aggression / Violence:No  ?Access to Firearms a concern: No  ?Gang Involvement:No  ?Patient / guardian was educated about steps to take if suicide or homicide risk level increases between visits: yes ?While future psychiatric events cannot be accurately predicted, the patient does not currently require acute inpatient psychiatric care and does not currently meet [facility] involuntary commitment criteria. ? ?Medical History/Surgical History: ?Past Medical History:  ?Diagnosis Date  ? Allergic rhinitis   ? Depression   ? History of Hodgkin's lymphoma MAY 2004  ? IBS (irritable bowel syndrome)   ? Migraines   ? Sleep disorder   ? ? ?Past Surgical History:  ?Procedure Laterality Date  ? CHOLECYSTECTOMY    ? LYMPH NODE BIOPSY    ? ? ?Medications: ?Current Outpatient Medications  ?Medication Sig Dispense Refill  ? acetaminophen (TYLENOL) 325 MG tablet Take 650 mg by mouth every 6 (six) hours as needed.    ? busPIRone (BUSPAR) 15 MG tablet Take 1 tablet (15 mg total) by mouth 2 (two) times daily. 180 tablet 1  ? EPINEPHrine (EPIPEN 2-PAK) 0.3 mg/0.3 mL IJ SOAJ injection Inject into the muscle as needed (INJECTION PROTOCOL).    ?  lamoTRIgine (LAMICTAL) 150 MG tablet Take 1 tablet (150 mg total) by mouth daily. 90 tablet 1  ? levocetirizine (XYZAL) 5 MG tablet Take 5 mg by mouth every evening.    ? lidocaine (LIDODERM) 5 % Place 1 patch onto the skin daily. Remove & Discard patch within 12 hours or as directed by MD    ? [START ON 08/18/2021] methylphenidate (RITALIN) 20 MG tablet Take 1/2-1 po BID 60 tablet 0  ? methylphenidate (RITALIN) 20 MG tablet Take 1/2-1 po BID 60 tablet 0  ? methylphenidate (RITALIN) 20 MG tablet Take 1/2-1 po BID 60 tablet 0  ? propranolol (INDERAL) 10 MG tablet TAKE 1 TO 2 TABLETS BY MOUTH TWICE A DAY AS NEEDED FOR ANXIETY 360 tablet 1  ? QUEtiapine (SEROQUEL) 100 MG tablet TAKE 2-3 TABLETS BY MOUTH AT BEDTIME 270 tablet 1  ? ?No current facility-administered medications for this visit.  ? ?Subjective: Patient arrived on time for today's session.  I spent approximately 1 year since her last visit.  She shared events over the past few months and more recently.  She stated that she is considering moving to day shift at work.  She has worked at nighttime, third shift for the past 16 years and plans to transition to day time sometime over the next month or so.  She chose to make this change, albeit she has some anxiety such as going to  stores with more people present.  She reports some anxiety in the social situations.  She identified how she feels she might be able to adjust and possibly feel less anxiety over time.  She went on to share ongoing stressors in her marriage, she stated her husband continues to drink alcohol most evenings.  She stated this is concerning due to his sometimes getting intoxicated with blackouts.  She went on to share many thoughts and feelings of distress as she stated that she does not feel her husband listens to her, that he often will be critical of her even if she is trying to talk about issues such as work.  Refocused her on what she feels is in her control which is identifying ways she  can care for herself.  She stated that she needs to ensure she is getting enough rest and plans to be intentional about this between sessions. ? ?Interventions: Further assessment, CBT, supportive therapy ? ?Diagnoses:  ?  ICD-10-CM   ?1. Attention deficit disorder (ADD) without hyperactivity  F98.8   ?  ? ? ? ? ?Plan: Patient is to use CBT, mindfulness and coping skills to help manage decrease symptoms. Patient is to increase her ability to identify when feeling emotionally distressed, upset to better regulate her responses in her marital relationship.  ?  ?Long-term goal:  Reduce overall level, frequency, and intensity of the feelings of depression, anxiety so that daily functioning is not impaired.  Patient to increase effective communication in her marital relationship. ? ? ?Short-term goal: To identify and process feelings related to the disappointment of past painful events that increase emotional distress. ?                  Verbally express understanding of the relationship between depressed mood and repression of feelings - such as  anger, hurt, and sadness. ?                             Patient to improve effective communication in her marital relationship ? ?Assessment of progress:  progressing  ? ? ?Waldron Session, [facility]  ?

## 2021-09-01 ENCOUNTER — Ambulatory Visit (INDEPENDENT_AMBULATORY_CARE_PROVIDER_SITE_OTHER): Payer: 59 | Admitting: Mental Health

## 2021-09-01 DIAGNOSIS — F988 Other specified behavioral and emotional disorders with onset usually occurring in childhood and adolescence: Secondary | ICD-10-CM

## 2021-09-01 DIAGNOSIS — R69 Illness, unspecified: Secondary | ICD-10-CM | POA: Diagnosis not present
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
## 2021-09-15 ENCOUNTER — Telehealth (INDEPENDENT_AMBULATORY_CARE_PROVIDER_SITE_OTHER): Payer: 59 | Admitting: Psychiatry

## 2021-09-15 ENCOUNTER — Ambulatory Visit (INDEPENDENT_AMBULATORY_CARE_PROVIDER_SITE_OTHER): Payer: 59 | Admitting: Mental Health

## 2021-09-15 ENCOUNTER — Encounter: Payer: Self-pay | Admitting: Psychiatry

## 2021-09-15 DIAGNOSIS — F988 Other specified behavioral and emotional disorders with onset usually occurring in childhood and adolescence: Secondary | ICD-10-CM | POA: Diagnosis not present

## 2021-09-15 DIAGNOSIS — R69 Illness, unspecified: Secondary | ICD-10-CM | POA: Diagnosis not present

## 2021-09-15 DIAGNOSIS — F4010 Social phobia, unspecified: Secondary | ICD-10-CM | POA: Diagnosis not present

## 2021-09-15 DIAGNOSIS — F3181 Bipolar II disorder: Secondary | ICD-10-CM | POA: Diagnosis not present

## 2021-09-15 MED ORDER — PROPRANOLOL HCL 10 MG PO TABS: ORAL_TABLET | ORAL | 1 refills | Status: DC

## 2021-09-15 MED ORDER — LAMOTRIGINE 150 MG PO TABS: 150.0000 mg | ORAL_TABLET | Freq: Every day | ORAL | 0 refills | Status: DC

## 2021-09-15 MED ORDER — METHYLPHENIDATE HCL 20 MG PO TABS: 20.0000 mg | ORAL_TABLET | Freq: Two times a day (BID) | ORAL | 0 refills | Status: DC

## 2021-09-15 NOTE — Progress Notes (Signed)
[facility] note  Name: Jessica Suarez Date: 09/15/2021 MRN: [id number] DOB: 12-24-1980 PCP: Sigmund Hazel, MD  Time spent: 49 minutes  Treatment: Ind. therapy  Mental Status Exam:    Appearance:   Casual     Behavior:  Appropriate  Motor:  Normal  Speech/Language:   Clear and Coherent  Affect:  Full range  Mood:  anxious  Thought process:  normal  Thought content:    WNL  Sensory/Perceptual disturbances:    none  Orientation:  x4  Attention:  good  Concentration:  Good  Memory:  WNL  Fund of knowledge:   Good  Insight:    Good  Judgment:   Good  Impulse Control:  Good   Reported Symptoms:  Anxiety, some sad feelings   Risk Assessment: Danger to Self:  No Self-injurious Behavior: No Danger to Others: No Duty to Warn:no Physical Aggression / Violence:No  Access to Firearms a concern: No  Gang Involvement:No  Patient / guardian was educated about steps to take if suicide or homicide risk level increases between visits: yes While future psychiatric events cannot be accurately predicted, the patient does not currently require acute inpatient psychiatric care and does not currently meet [facility] involuntary commitment criteria.  Medical History/Surgical History: Past Medical History:  Diagnosis Date   Allergic rhinitis    Depression    History of Hodgkin's lymphoma MAY 2004   IBS (irritable bowel syndrome)    Migraines    Sleep disorder     Past Surgical History:  Procedure Laterality Date   CHOLECYSTECTOMY     LYMPH NODE BIOPSY      Medications: Current Outpatient Medications  Medication Sig Dispense Refill   acetaminophen (TYLENOL) 325 MG tablet Take 650 mg by mouth every 6 (six) hours as needed.     busPIRone (BUSPAR) 15 MG tablet Take 1 tablet (15 mg total) by mouth 2 (two) times daily. 180 tablet 1   EPINEPHrine (EPIPEN 2-PAK) 0.3 mg/0.3 mL IJ SOAJ injection Inject into the muscle as needed (INJECTION PROTOCOL).      lamoTRIgine (LAMICTAL) 150 MG tablet Take 1 tablet (150 mg total) by mouth daily. 90 tablet 0   levocetirizine (XYZAL) 5 MG tablet Take 5 mg by mouth every evening.     lidocaine (LIDODERM) 5 % Place 1 patch onto the skin daily. Remove & Discard patch within 12 hours or as directed by MD     methylphenidate (RITALIN) 20 MG tablet Take 1 tablet (20 mg total) by mouth 2 (two) times daily with breakfast and lunch. May take an additional 1/2 tab as needed 75 tablet 0   propranolol (INDERAL) 10 MG tablet TAKE 1 TO 2 TABLETS BY MOUTH TWICE A DAY AS NEEDED FOR ANXIETY 360 tablet 1   QUEtiapine (SEROQUEL) 100 MG tablet TAKE 2-3 TABLETS BY MOUTH AT BEDTIME 270 tablet 1   No current facility-administered medications for this visit.   Subjective: Patient arrived on time for today's session.  She shared recent events and progress toward goals. Continues to report some strain in her marital relationship, chance of tendencies her husband has towards being critical of her. She shared examples of after she's around his family members at times he will make comments to her about why she spoke about certain topics. She said she came from a family that was more open, honest and communicative in their relationships, where she said his family is less communicative about issues. She also stated that she has spoken with  him about limiting his alcohol consumption when they go to social events and how there have been misses where he's been less intoxicated. She didn't find wanting more support for her husband also, she would like him to engage in his own therapy but she states he is resistant. 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    Interventions: Further assessment, CBT, supportive  therapy  Diagnoses:    ICD-10-CM   1. Attention deficit disorder (ADD) without hyperactivity  F98.8        Plan: Patient is to use CBT, mindfulness and coping skills to help decrease symptoms. Patient is to increase her ability to identify when feeling emotionally distressed, upset to better regulate her responses in her marital relationship.    Long-term goal:  Reduce overall level, frequency, and intensity of the feelings of depression, anxiety for at least 3 consecutive months per patient report.  Patient to increase effective communication in her marital relationship.   Short-term goal: To identify and process feelings related to the disappointment of past painful events that increase emotional distress.                   Verbally express understanding of the relationship between depressed mood and repression of feelings - such as  anger, hurt, and sadness.                              Patient to improve effective communication in her marital relationship  Assessment of progress:  progressing    Waldron Session, [facility]

## 2021-09-15 NOTE — Progress Notes (Signed)
Jessica Suarez ?[id number] ?[birth year] ?41 y.o. ? ?Virtual Visit via Video Note ? ?I connected with pt @ on 09/15/21 at  8:30 AM EDT by a video enabled telemedicine application and verified that I am speaking with the correct person using two identifiers. ?  ?I discussed the limitations of evaluation and management by telemedicine and the availability of in person appointments. The patient expressed understanding and agreed to proceed. ? ?I discussed the assessment and treatment plan with the patient. The patient was provided an opportunity to ask questions and all were answered. The patient agreed with the plan and demonstrated an understanding of the instructions. ?  ?The patient was advised to call back or seek an in-person evaluation if the symptoms worsen or if the condition fails to improve as anticipated. ? ?I provided 40 minutes of non-face-to-face time during this encounter.  The patient was located at home.  The provider was located at home in [place]. ? ? ?Corie Chiquito, PMHNP  ? ?Subjective:  ? ?Patient ID:  Jessica Suarez is a 41 y.o. (DOB 1980-12-07) female. ? ?Chief Complaint:  ?Chief Complaint  ?Patient presents with  ? ADHD  ? Follow-up  ?  Mood disturbance, Anxiety, insomnia  ? ? ?HPI ?Jessica Suarez presents for follow-up of Bipolar Disorder, anxiety, ADHD, and insomnia.  ? ?Went to the [employer] this past weekend and there were bed bugs where she stayed. She had some bed bug bites but does not think that any came home with them.  ? 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 She reports that she is hopeful that change in work schedule will be helpful for her marital relationship.  ? ?She reports that her mood has been "a little depressed." She reports that she recently spent over $300 on shoes for work. She denies any other excessive spending or impulsive behaviors. She reports that she has been sleeping more. She has been socially  withdrawn and not wanting to interact with others. She reports, "I feel a little better" and hopeful about change in work schedule. She reports some difficulty with focus and motivation. Reports that sometimes she will come into work and "just sit there... I don't know what to do" or where to start. She reports that she is easily distracted. Difficulty sustaining attention in conference calls. She reports that Methylphenidate does not seem to be as effective as  it used to be. She took a 1/2 tab of Methylphenidate prior to a conference call that she was leading. She reports sleeping excessively on the weekends. She reports that her energy and motivation have been lower. She reports that her appetite has been ok. She reports that she drinks some during the week after daytime work calls so she can wind down afterwards. She reports that she has some anxiety "but it's controllable." She reports that anxiety is mostly situational. Denies SI.  ? ?At times she is unable to take full dose of Seroquel since it would affect her ability to wake up in time for night shift.  ? ?She has been seeing Elio Forget, [facility] for therapy.  ? ?She will be working [employer] starting at 6 am.  ? ?Past Medication Trials: ?Latuda- "did not like how it made me feel" ?Seroquel ?Wellbutrin-ineffective ?Zoloft ?Effexor ?Prozac-helpful for PMDD signs and symptoms ?Lexapro ?Provigil-not as helpful for daytime somnolence ?Methylphenidate ?BuSpar-helpful for anxiety ?Lamictal-helpful for mood ?Propranolol ?Guanfacine-ineffective ?  ?  ? ?  Review of Systems:  ?Review of Systems  ?Cardiovascular:  Negative for palpitations.  ?Musculoskeletal:  Negative for gait problem.  ?     Shoulder pain  ?Neurological:  Negative for tremors.  ?Psychiatric/Behavioral:    ?     Please refer to HPI  ? ?Medications: I have reviewed the patient's current medications. ? ?Current Outpatient Medications  ?Medication Sig Dispense Refill  ? acetaminophen (TYLENOL) 325  MG tablet Take 650 mg by mouth every 6 (six) hours as needed.    ? busPIRone (BUSPAR) 15 MG tablet Take 1 tablet (15 mg total) by mouth 2 (two) times daily. 180 tablet 1  ? EPINEPHrine (EPIPEN 2-PAK) 0.3 mg/0.3 mL IJ SOAJ injection Inject into the muscle as needed (INJECTION PROTOCOL).    ? lamoTRIgine (LAMICTAL) 150 MG tablet Take 1 tablet (150 mg total) by mouth daily. 90 tablet 0  ? levocetirizine (XYZAL) 5 MG tablet Take 5 mg by mouth every evening.    ? lidocaine (LIDODERM) 5 % Place 1 patch onto the skin daily. Remove & Discard patch within 12 hours or as directed by MD    ? methylphenidate (RITALIN) 20 MG tablet Take 1 tablet (20 mg total) by mouth 2 (two) times daily with breakfast and lunch. May take an additional 1/2 tab as needed 75 tablet 0  ? propranolol (INDERAL) 10 MG tablet TAKE 1 TO 2 TABLETS BY MOUTH TWICE A DAY AS NEEDED FOR ANXIETY 360 tablet 1  ? QUEtiapine (SEROQUEL) 100 MG tablet TAKE 2-3 TABLETS BY MOUTH AT BEDTIME 270 tablet 1  ? ?No current facility-administered medications for this visit.  ? ? ?Medication Side Effects: None ? ?Allergies: No Known Allergies ? ?Past Medical History:  ?Diagnosis Date  ? Allergic rhinitis   ? Depression   ? History of Hodgkin's lymphoma MAY 2004  ? IBS (irritable bowel syndrome)   ? Migraines   ? Sleep disorder   ? ? ?Family History  ?Problem Relation Age of Onset  ? Hypertension Mother   ? Hypercholesterolemia Mother   ? Asthma Father   ? Diabetes Father   ? Asthma Brother   ? Bipolar disorder Brother   ? Suicidality Brother   ? Drug abuse Paternal Aunt   ? Aneurysm Paternal Aunt   ? Anxiety disorder Other   ? Cancer Paternal Aunt   ? ? ?Social History  ? ?Socioeconomic History  ? Marital status: Single  ?  Spouse name: Not on file  ? Number of children: Not on file  ? Years of education: Not on file  ? Highest education level: Not on file  ?Occupational History  ? Not on file  ?Tobacco Use  ? Smoking status: Passive Smoke Exposure - Never Smoker  ? Smokeless  tobacco: Never  ?Substance and Sexual Activity  ? Alcohol use: Yes  ?  Alcohol/week: 2.0 standard drinks  ?  Types: 2 Shots of liquor per week  ? Drug use: No  ? Sexual activity: Yes  ?  Birth control/protection: None  ?Other Topics Concern  ? Not on file  ?Social History Narrative  ? Not on file  ? ?Social Determinants of Health  ? ?Financial Resource Strain: Not on file  ?Food Insecurity: Not on file  ?Transportation Needs: Not on file  ?Physical Activity: Not on file  ?Stress: Not on file  ?Social Connections: Not on file  ?Intimate Partner Violence: Not on file  ? ? ?Past Medical History, Surgical history, Social history, and Family history were reviewed  and updated as appropriate.  ? ?Please see review of systems for further details on the patient's review from today.  ? ?Objective:  ? ?Physical Exam:  ?There were no vitals taken for this visit. ? ?Physical Exam ?Neurological:  ?   Mental Status: She is alert and oriented to person, place, and time.  ?   Cranial Nerves: No dysarthria.  ?Psychiatric:     ?   Attention and Perception: Attention and perception normal.     ?   Mood and Affect: Mood is not anxious.     ?   Speech: Speech normal.     ?   Behavior: Behavior is cooperative.     ?   Thought Content: Thought content normal. Thought content is not paranoid or delusional. Thought content does not include homicidal or suicidal ideation. Thought content does not include homicidal or suicidal plan.     ?   Cognition and Memory: Cognition and memory normal.     ?   Judgment: Judgment normal.  ?   Comments: Insight intact ?Dysthymic mood  ? ? ?Lab Review:  ?No results found for: NA, K, CL, CO2, GLUCOSE, BUN, CREATININE, CALCIUM, PROT, ALBUMIN, AST, ALT, ALKPHOS, BILITOT, GFRNONAA, GFRAA ? ?No results found for: WBC, RBC, HGB, HCT, PLT, MCV, MCH, MCHC, RDW, LYMPHSABS, MONOABS, EOSABS, BASOSABS ? ?No results found for: POCLITH, LITHIUM  ? ?No results found for: PHENYTOIN, PHENOBARB, VALPROATE, CBMZ   ? ?.res ?Assessment: Plan:   ? ?Pt seen for 40 minutes and time spent discussing recent difficulties with concentration and focus and agreed to increase Ritalin to previous dose of 20 mg BID and an additional 10 mg a

## 2021-09-21 ENCOUNTER — Ambulatory Visit: Payer: 59 | Admitting: Mental Health

## 2021-10-07 ENCOUNTER — Ambulatory Visit (INDEPENDENT_AMBULATORY_CARE_PROVIDER_SITE_OTHER): Payer: 59 | Admitting: Mental Health

## 2021-10-07 DIAGNOSIS — F988 Other specified behavioral and emotional disorders with onset usually occurring in childhood and adolescence: Secondary | ICD-10-CM | POA: Diagnosis not present

## 2021-10-07 DIAGNOSIS — R69 Illness, unspecified: Secondary | ICD-10-CM | POA: Diagnosis not present

## 2021-10-07 NOTE — Addendum Note (Signed)
Addended by: Waldron Session D on: 10/07/2021 09:03 AM   Modules accepted: Level of Service

## 2021-10-07 NOTE — Progress Notes (Addendum)
[facility] note  Name: Jessica Suarez Date: 10/07/2021 MRN: [id number] DOB: 10/11/1980 PCP: Kathyrn Lass, MD  Time spent: 55 minutes  Treatment: Ind. therapy  Mental Status Exam:    Appearance:   Casual     Behavior:  Appropriate  Motor:  Normal  Speech/Language:   Clear and Coherent  Affect:  Full range  Mood:  anxious  Thought process:  normal  Thought content:    WNL  Sensory/Perceptual disturbances:    none  Orientation:  x4  Attention:  good  Concentration:  Good  Memory:  WNL  Fund of knowledge:   Good  Insight:    Good  Judgment:   Good  Impulse Control:  Good   Reported Symptoms:  Anxiety, some sad feelings   Risk Assessment: Danger to Self:  No Self-injurious Behavior: No Danger to Others: No Duty to Warn:no Physical Aggression / Violence:No  Access to Firearms a concern: No  Gang Involvement:No  Patient / guardian was educated about steps to take if suicide or homicide risk level increases between visits: yes While future psychiatric events cannot be accurately predicted, the patient does not currently require acute inpatient psychiatric care and does not currently meet [facility] involuntary commitment criteria.  Medical History/Surgical History: Past Medical History:  Diagnosis Date   Allergic rhinitis    Depression    History of Hodgkin's lymphoma MAY 2004   IBS (irritable bowel syndrome)    Migraines    Sleep disorder     Past Surgical History:  Procedure Laterality Date   CHOLECYSTECTOMY     LYMPH NODE BIOPSY      Medications: Current Outpatient Medications  Medication Sig Dispense Refill   acetaminophen (TYLENOL) 325 MG tablet Take 650 mg by mouth every 6 (six) hours as needed.     busPIRone (BUSPAR) 15 MG tablet Take 1 tablet (15 mg total) by mouth 2 (two) times daily. 180 tablet 1   EPINEPHrine (EPIPEN 2-PAK) 0.3 mg/0.3 mL IJ SOAJ injection Inject into the muscle as needed (INJECTION PROTOCOL).      lamoTRIgine (LAMICTAL) 150 MG tablet Take 1 tablet (150 mg total) by mouth daily. 90 tablet 0   levocetirizine (XYZAL) 5 MG tablet Take 5 mg by mouth every evening.     lidocaine (LIDODERM) 5 % Place 1 patch onto the skin daily. Remove & Discard patch within 12 hours or as directed by MD     methylphenidate (RITALIN) 20 MG tablet Take 1 tablet (20 mg total) by mouth 2 (two) times daily with breakfast and lunch. May take an additional 1/2 tab as needed 75 tablet 0   propranolol (INDERAL) 10 MG tablet TAKE 1 TO 2 TABLETS BY MOUTH TWICE A DAY AS NEEDED FOR ANXIETY 360 tablet 1   QUEtiapine (SEROQUEL) 100 MG tablet TAKE 2-3 TABLETS BY MOUTH AT BEDTIME 270 tablet 1   No current facility-administered medications for this visit.   Subjective: Patient arrived on time for today's session.  Assessed progress toward goals as well as relevant recent events.  She stated that she made to first shift on her job, reports he spent about 2 weeks and has enjoyed the change thus far.  She stated her sleep quality has improved, feels more rested and has noticed that she is more upbeat and engaging with others even when going to the grocery store.  She reports that her anxiety has gone down considerably when going to stores even when it could be crowded.  Also attributes  this to potentially having less people to manage as the day shift has about half the staff of her previous shift which means she is less inundated by employees with questions and concerns.  She went on to share her relational changes with her husband, she reports that he has been drinking less and they have been having less arguments.  She stated they recently were out with a couple who are currently in couples counseling.  She stated that her husband mentioned that they could possibly benefit from couples counseling as well.  Patient stated they have not talked about it since, it has been about 4 days but she plans to bring it up again as she had asked him in  the past and he was resistant; she is hopeful that they can start.  She went on to share feelings related to past incidents where he may be critical of her, if she says the wrong thing around others, family or friends etc.  She stated he has a tendency to be jealous and she is unsure of reasons as he will not process further thoughts and feelings when she tries to engage him in discussions.   Interventions: Further assessment, CBT, supportive therapy  Diagnoses:    ICD-10-CM   1. 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 Patient is to increase her ability to identify when feeling emotionally distressed, upset to better regulate her responses in her marital relationship.  Reviewed with husband the potential benefits of scheduling couples counseling.   Long-term goal:  Reduce overall level, frequency, and intensity of the feelings of depression, anxiety for at least 3 consecutive months per patient report.  Patient to increase effective communication in her marital relationship.   Short-term goal: To identify and process feelings related to the disappointment of past painful events that increase emotional distress.                   Verbally express understanding of the relationship between depressed mood and repression of feelings - such as  anger, hurt, and sadness.                              Patient to improve effective communication in her marital relationship  Assessment of progress:  progressing    [place], [facility]

## 2021-10-14 ENCOUNTER — Telehealth (INDEPENDENT_AMBULATORY_CARE_PROVIDER_SITE_OTHER): Payer: 59 | Admitting: Psychiatry

## 2021-10-14 ENCOUNTER — Encounter: Payer: Self-pay | Admitting: Psychiatry

## 2021-10-14 DIAGNOSIS — F3181 Bipolar II disorder: Secondary | ICD-10-CM | POA: Diagnosis not present

## 2021-10-14 DIAGNOSIS — R69 Illness, unspecified: Secondary | ICD-10-CM | POA: Diagnosis not present

## 2021-10-14 DIAGNOSIS — F988 Other specified behavioral and emotional disorders with onset usually occurring in childhood and adolescence: Secondary | ICD-10-CM | POA: Diagnosis not present

## 2021-10-14 MED ORDER — METHYLPHENIDATE HCL 20 MG PO TABS: 20.0000 mg | ORAL_TABLET | Freq: Two times a day (BID) | ORAL | 0 refills | Status: DC

## 2021-10-14 MED ORDER — LAMOTRIGINE 150 MG PO TABS: 150.0000 mg | ORAL_TABLET | Freq: Every day | ORAL | 0 refills | Status: DC

## 2021-10-14 NOTE — Progress Notes (Signed)
Jessica Suarez [id number] [birth year] 41 y.o.  Virtual Visit via Video Note  I connected with pt @ on 10/14/21 at  8:30 AM EDT by a video enabled telemedicine application and verified that I am speaking with the correct person using two identifiers.   I discussed the limitations of evaluation and management by telemedicine and the availability of in person appointments. The patient expressed understanding and agreed to proceed.  I discussed the assessment and treatment plan with the patient. The patient was provided an opportunity to ask questions and all were answered. The patient agreed with the plan and demonstrated an understanding of the instructions.   The patient was advised to call back or seek an in-person evaluation if the symptoms worsen or if the condition fails to improve as anticipated.  I provided 25 minutes of non-face-to-face time during this encounter.  The patient was located at home.  The provider was located at [facility].   Corie Chiquito, PMHNP   Subjective:   Patient ID:  Jessica Suarez is a 41 y.o. (DOB 09/05/1980) female.  Chief Complaint:  Chief Complaint  Patient presents with   Follow-up    Anxiety, mood disturbance, and insomnia    HPI Jessica Suarez presents for follow-up of Bipolar D/O, Anxiety, ADHD, and insomnia. She reports, "this week is not good" and has been asked to come in at 3-4 am to help with night shift. Otherwise, she is usually home by 4:30 and able to have more time with her husband. Now able to have dinner with her husband and get more sleep. She has been trying to figure out the best times to take Methylphenidate with schedule change. Sometimes she will take 1.5 tabs in the morning and then 1/2 tab at lunch. Sometimes takes 1 tab in the morning and 1/2 tab at lunch and 1/2 tab in the afternoon.   She reports that her mood has improved and anxiety is less, even when going into crowded stores. She reports that there are less  employees to manage on day shift and that she likes the smaller dynamic. She reports that she has been able to get better quality sleep with less middle of the night awakenings. Has been able to take Seroquel 100 mg on new schedule. Denies depressed mood or manic s/s. Energy and motivation have been good. She is hoping to start exercise soon. Concentration has "not been too bad." She reports that she is learning new work flow. Denies SI.   She reports that her ETOH intake has decreased about 50%. Reports husband's ETOH intake has also decreased.   Has been seeing Elio Forget, [facility] for therapy.   Past Medication Trials: Latuda- "did not like how it made me feel" Seroquel Wellbutrin-ineffective Zoloft Effexor Prozac-helpful for PMDD signs and symptoms Lexapro Provigil-not as helpful for daytime somnolence Methylphenidate BuSpar-helpful for anxiety Lamictal-helpful for mood Propranolol Guanfacine-ineffective  Review of Systems:  Review of Systems  Musculoskeletal:  Negative for gait problem.       Shoulder pain has decreased overall. Had more pain yesterday after moving some things around.   Psychiatric/Behavioral:         Please refer to HPI    Medications: I have reviewed the patient's current medications.  Current Outpatient Medications  Medication Sig Dispense Refill   [START ON 11/18/2021] methylphenidate (RITALIN) 20 MG tablet Take 1 tablet (20 mg total) by mouth 2 (two) times daily. May take an additional 1/2 tab as needed 75 tablet 0  acetaminophen (TYLENOL) 325 MG tablet Take 650 mg by mouth every 6 (six) hours as needed.     busPIRone (BUSPAR) 15 MG tablet Take 1 tablet (15 mg total) by mouth 2 (two) times daily. 180 tablet 1   EPINEPHrine (EPIPEN 2-PAK) 0.3 mg/0.3 mL IJ SOAJ injection Inject into the muscle as needed (INJECTION PROTOCOL).     lamoTRIgine (LAMICTAL) 150 MG tablet Take 1 tablet (150 mg total) by mouth daily. 90 tablet 0   levocetirizine (XYZAL) 5 MG  tablet Take 5 mg by mouth every evening.     lidocaine (LIDODERM) 5 % Place 1 patch onto the skin daily. Remove & Discard patch within 12 hours or as directed by MD     [START ON 10/21/2021] methylphenidate (RITALIN) 20 MG tablet Take 1 tablet (20 mg total) by mouth 2 (two) times daily with breakfast and lunch. May take an additional 1/2 tab as needed 75 tablet 0   propranolol (INDERAL) 10 MG tablet TAKE 1 TO 2 TABLETS BY MOUTH TWICE A DAY AS NEEDED FOR ANXIETY 360 tablet 1   QUEtiapine (SEROQUEL) 100 MG tablet TAKE 2-3 TABLETS BY MOUTH AT BEDTIME 270 tablet 1   No current facility-administered medications for this visit.    Medication Side Effects: None  Allergies: No Known Allergies  Past Medical History:  Diagnosis Date   Allergic rhinitis    Depression    History of Hodgkin's lymphoma MAY 2004   IBS (irritable bowel syndrome)    Migraines    Sleep disorder     Family History  Problem Relation Age of Onset   Hypertension Mother    Hypercholesterolemia Mother    Asthma Father    Diabetes Father    Asthma Brother    Bipolar disorder Brother    Suicidality Brother    Drug abuse Paternal Aunt    Aneurysm Paternal Aunt    Anxiety disorder Other    Cancer Paternal Aunt     Social History   Socioeconomic History   Marital status: Single    Spouse name: Not on file   Number of children: Not on file   Years of education: Not on file   Highest education level: Not on file  Occupational History   Not on file  Tobacco Use   Smoking status: Passive Smoke Exposure - Never Smoker   Smokeless tobacco: Never  Substance and Sexual Activity   Alcohol use: Yes    Alcohol/week: 2.0 standard drinks of alcohol    Types: 2 Shots of liquor per week   Drug use: No   Sexual activity: Yes    Birth control/protection: None  Other Topics Concern   Not on file  Social History Narrative   Not on file   Social Determinants of Health   Financial Resource Strain: Not on file  Food  Insecurity: Not on file  Transportation Needs: Not on file  Physical Activity: Not on file  Stress: Not on file  Social Connections: Not on file  Intimate Partner Violence: Not on file    Past Medical History, Surgical history, Social history, and Family history were reviewed and updated as appropriate.   Please see review of systems for further details on the patient's review from today.   Objective:   Physical Exam:  There were no vitals taken for this visit.  Physical Exam Neurological:     Mental Status: She is alert and oriented to person, place, and time.     Cranial Nerves: No dysarthria.  Psychiatric:        Attention and Perception: Attention and perception normal.        Mood and Affect: Mood normal.        Speech: Speech normal.        Behavior: Behavior is cooperative.        Thought Content: Thought content normal. Thought content is not paranoid or delusional. Thought content does not include homicidal or suicidal ideation. Thought content does not include homicidal or suicidal plan.        Cognition and Memory: Cognition and memory normal.        Judgment: Judgment normal.     Comments: Insight intact     Lab Review:  No results found for: "NA", "K", "CL", "CO2", "GLUCOSE", "BUN", "CREATININE", "CALCIUM", "PROT", "ALBUMIN", "AST", "ALT", "ALKPHOS", "BILITOT", "GFRNONAA", "GFRAA"  No results found for: "WBC", "RBC", "HGB", "HCT", "PLT", "MCV", "MCH", "MCHC", "RDW", "LYMPHSABS", "MONOABS", "EOSABS", "BASOSABS"  No results found for: "POCLITH", "LITHIUM"   No results found for: "PHENYTOIN", "PHENOBARB", "VALPROATE", "CBMZ"   .res Assessment: Plan:   Pt seen for 25 minutes and time spent discussing response to Ritalin. She reports that some work days she is finding the additional 1/2 tablet to be helpful for focus without adverse effects. Will continue Ritalin 20 mg po BID and 1/2 tab as needed.  Continue Seroquel 100 mg 2-3 tabs po QHS for mood, anxiety, and  insomnia. Continue Lamictal 150 mg po qd for mood stabilization.  Continue Buspar 15 mg po BID for anxiety.  Continue Propranolol 10 mg 1-2 tabs po BID prn anxiety.  Pt to follow-up in 2 months or sooner if clinically indicated.  Patient advised to contact office with any questions, adverse effects, or acute worsening in signs and symptoms.   Jessica Suarez was seen today for follow-up.  Diagnoses and all orders for this visit:  Attention deficit disorder (ADD) without hyperactivity -     methylphenidate (RITALIN) 20 MG tablet; Take 1 tablet (20 mg total) by mouth 2 (two) times daily with breakfast and lunch. May take an additional 1/2 tab as needed -     methylphenidate (RITALIN) 20 MG tablet; Take 1 tablet (20 mg total) by mouth 2 (two) times daily. May take an additional 1/2 tab as needed  Bipolar II disorder (HCC) -     lamoTRIgine (LAMICTAL) 150 MG tablet; Take 1 tablet (150 mg total) by mouth daily.     Please see After Visit Summary for patient specific instructions.  No future appointments.   No orders of the defined types were placed in this encounter.     -------------------------------

## 2021-11-23 ENCOUNTER — Other Ambulatory Visit: Payer: Self-pay

## 2021-11-23 ENCOUNTER — Telehealth: Payer: Self-pay | Admitting: Psychiatry

## 2021-11-23 DIAGNOSIS — F988 Other specified behavioral and emotional disorders with onset usually occurring in childhood and adolescence: Secondary | ICD-10-CM

## 2021-11-23 MED ORDER — METHYLPHENIDATE HCL 20 MG PO TABS: 20.0000 mg | ORAL_TABLET | Freq: Two times a day (BID) | ORAL | 0 refills | Status: DC

## 2021-11-23 NOTE — Telephone Encounter (Signed)
Pended.

## 2021-11-23 NOTE — Telephone Encounter (Signed)
Pt requesting Rx generic Ritalin 20 mg @ [facility] [address]

## 2021-12-13 ENCOUNTER — Telehealth (INDEPENDENT_AMBULATORY_CARE_PROVIDER_SITE_OTHER): Payer: 59 | Admitting: Psychiatry

## 2021-12-13 ENCOUNTER — Encounter: Payer: Self-pay | Admitting: Psychiatry

## 2021-12-13 DIAGNOSIS — F3181 Bipolar II disorder: Secondary | ICD-10-CM | POA: Diagnosis not present

## 2021-12-13 DIAGNOSIS — F4010 Social phobia, unspecified: Secondary | ICD-10-CM

## 2021-12-13 DIAGNOSIS — R69 Illness, unspecified: Secondary | ICD-10-CM | POA: Diagnosis not present

## 2021-12-13 DIAGNOSIS — F988 Other specified behavioral and emotional disorders with onset usually occurring in childhood and adolescence: Secondary | ICD-10-CM

## 2021-12-13 MED ORDER — METHYLPHENIDATE HCL 20 MG PO TABS: 20.0000 mg | ORAL_TABLET | Freq: Two times a day (BID) | ORAL | 0 refills | Status: DC

## 2021-12-13 MED ORDER — LAMOTRIGINE 150 MG PO TABS: 150.0000 mg | ORAL_TABLET | Freq: Every day | ORAL | 0 refills | Status: DC

## 2021-12-13 MED ORDER — BUSPIRONE HCL 15 MG PO TABS: 15.0000 mg | ORAL_TABLET | Freq: Two times a day (BID) | ORAL | 1 refills | Status: DC

## 2021-12-13 NOTE — Progress Notes (Signed)
Jessica Suarez [id number] July 14, 1980 41 y.o.  Virtual Visit via Video Note  I connected with pt @ on 12/13/21 at 10:30 AM EDT by a video enabled telemedicine application and verified that I am speaking with the correct person using two identifiers.   I discussed the limitations of evaluation and management by telemedicine and the availability of in person appointments. The patient expressed understanding and agreed to proceed.  I discussed the assessment and treatment plan with the patient. The patient was provided an opportunity to ask questions and all were answered. The patient agreed with the plan and demonstrated an understanding of the instructions.   The patient was advised to call back or seek an in-person evaluation if the symptoms worsen or if the condition fails to improve as anticipated.  I provided 24 minutes of non-face-to-face time during this encounter.  The patient was located at home.  The provider was located at [facility].   Corie Chiquito, PMHNP   Subjective:   Patient ID:  Jessica Suarez is a 42 y.o. (DOB Jul 05, 1980) female.  Chief Complaint:  Chief Complaint  Patient presents with   Follow-up    Anxiety, bipolar disorder, attention deficit disorder    HPI Jessica Suarez presents for follow-up of anxiety, mood disturbance, and ADHD.   She has been having pain in her shoulder and it seems to be worse during times of stress. She has been having to take Tylenol prn and Lidocaine patches. She reports that this has been long-standing and had seemed to be improving and then has been flaring back up again.   She reports that her anxiety has been "pretty good." She reports generalized anxiety has been "not bad." She had some anxiety before a birthday party recently and this was the first time social anxiety had prevented her from socializing. She reports that overall she has been able to be in other social gatherings without difficulties. She reports that  she prefers smaller groups. She reports that her mood has been ok. She was previously awaken nightly around 3 am and this has improved and is now sleeping until about 5 am before awakening. She is getting 7 hours of sleep regularly. She reports that she has been cooking more and eating regularly. She has been drinking more water. Energy and motivation have improved. She would like to start exercising "but I'm not there yet." Concentration has been ok. She is continuing to learn the optimal times to take Ritalin with her new work schedule. She usually takes Ritalin in the morning and then does not eat breakfast until about 10 am. Some mornings she has a meeting first thing in the morning. Denies SI.   Continues to work day shift and prefers this. She reports that she now recognizes how much stress she was under in the past.   She reports that she and her husband have decreased ETOH use.   Past Medication Trials: Latuda- "did not like how it made me feel" Seroquel Wellbutrin-ineffective Zoloft Effexor Prozac-helpful for PMDD signs and symptoms Lexapro Provigil-not as helpful for daytime somnolence Methylphenidate BuSpar-helpful for anxiety Lamictal-helpful for mood Propranolol Guanfacine-ineffective    Review of Systems:  Review of Systems  Musculoskeletal:  Negative for gait problem.       Shoulder pain  Neurological:  Negative for tremors.  Psychiatric/Behavioral:         Please refer to HPI    Medications: I have reviewed the patient's current medications.  Current Outpatient Medications  Medication  Sig Dispense Refill   [START ON 12/21/2021] methylphenidate (RITALIN) 20 MG tablet Take 1 tablet (20 mg total) by mouth 2 (two) times daily with breakfast and lunch. May take an additional 1/2 tab as needed 75 tablet 0   acetaminophen (TYLENOL) 325 MG tablet Take 650 mg by mouth every 6 (six) hours as needed.     busPIRone (BUSPAR) 15 MG tablet Take 1 tablet (15 mg total) by mouth 2  (two) times daily. 180 tablet 1   EPINEPHrine (EPIPEN 2-PAK) 0.3 mg/0.3 mL IJ SOAJ injection Inject into the muscle as needed (INJECTION PROTOCOL).     lamoTRIgine (LAMICTAL) 150 MG tablet Take 1 tablet (150 mg total) by mouth daily. 90 tablet 0   levocetirizine (XYZAL) 5 MG tablet Take 5 mg by mouth every evening.     lidocaine (LIDODERM) 5 % Place 1 patch onto the skin daily. Remove & Discard patch within 12 hours or as directed by MD     [START ON 02/15/2022] methylphenidate (RITALIN) 20 MG tablet Take 1 tablet (20 mg total) by mouth 2 (two) times daily. May take an additional 1/2 tab as needed 75 tablet 0   [START ON 01/18/2022] methylphenidate (RITALIN) 20 MG tablet Take 1 tablet (20 mg total) by mouth 2 (two) times daily with breakfast and lunch. May take an additional 1/2 tab as needed 75 tablet 0   propranolol (INDERAL) 10 MG tablet TAKE 1 TO 2 TABLETS BY MOUTH TWICE A DAY AS NEEDED FOR ANXIETY 360 tablet 1   QUEtiapine (SEROQUEL) 100 MG tablet TAKE 2-3 TABLETS BY MOUTH AT BEDTIME 270 tablet 1   No current facility-administered medications for this visit.    Medication Side Effects: None  Allergies: No Known Allergies  Past Medical History:  Diagnosis Date   Allergic rhinitis    Depression    History of Hodgkin's lymphoma MAY 2004   IBS (irritable bowel syndrome)    Migraines    Sleep disorder     Family History  Problem Relation Age of Onset   Hypertension Mother    Hypercholesterolemia Mother    Asthma Father    Diabetes Father    Asthma Brother    Bipolar disorder Brother    Suicidality Brother    Drug abuse Paternal Aunt    Aneurysm Paternal Aunt    Anxiety disorder Other    Cancer Paternal Aunt     Social History   Socioeconomic History   Marital status: Single    Spouse name: Not on file   Number of children: Not on file   Years of education: Not on file   Highest education level: Not on file  Occupational History   Not on file  Tobacco Use   Smoking  status: Passive Smoke Exposure - Never Smoker   Smokeless tobacco: Never  Substance and Sexual Activity   Alcohol use: Yes    Alcohol/week: 2.0 standard drinks of alcohol    Types: 2 Shots of liquor per week   Drug use: No   Sexual activity: Yes    Birth control/protection: None  Other Topics Concern   Not on file  Social History Narrative   Not on file   Social Determinants of Health   Financial Resource Strain: Not on file  Food Insecurity: Not on file  Transportation Needs: Not on file  Physical Activity: Not on file  Stress: Not on file  Social Connections: Not on file  Intimate Partner Violence: Not on file    Past  Medical History, Surgical history, Social history, and Family history were reviewed and updated as appropriate.   Please see review of systems for further details on the patient's review from today.   Objective:   Physical Exam:  There were no vitals taken for this visit.  Physical Exam Neurological:     Mental Status: She is alert and oriented to person, place, and time.     Cranial Nerves: No dysarthria.  Psychiatric:        Attention and Perception: Attention and perception normal.        Mood and Affect: Mood normal.        Speech: Speech normal.        Behavior: Behavior is cooperative.        Thought Content: Thought content normal. Thought content is not paranoid or delusional. Thought content does not include homicidal or suicidal ideation. Thought content does not include homicidal or suicidal plan.        Cognition and Memory: Cognition and memory normal.        Judgment: Judgment normal.     Comments: Insight intact     Lab Review:  No results found for: "NA", "K", "CL", "CO2", "GLUCOSE", "BUN", "CREATININE", "CALCIUM", "PROT", "ALBUMIN", "AST", "ALT", "ALKPHOS", "BILITOT", "GFRNONAA", "GFRAA"  No results found for: "WBC", "RBC", "HGB", "HCT", "PLT", "MCV", "MCH", "MCHC", "RDW", "LYMPHSABS", "MONOABS", "EOSABS", "BASOSABS"  No results  found for: "POCLITH", "LITHIUM"   No results found for: "PHENYTOIN", "PHENOBARB", "VALPROATE", "CBMZ"   .res Assessment: Plan:    Patient seen for 25 minutes and time spent discussing recent symptoms and adjusting administration times of her medication in response to change in work schedule.  She asks about optimum time to take Ritalin and if it is imperative for her to take it with food.  Discussed with it may be most beneficial for her to take Ritalin and that she may want to occasionally adjust administration times of Ritalin around work schedule since she has meetings at various times throughout the workday.  Will continue Ritalin 20 mg twice daily and an additional half tablet as needed. Continue BuSpar 15 mg twice daily for anxiety. Continue lamotrigine 150 mg daily for mood stabilization. Continue Seroquel 100 mg 1 to 3 tablets at bedtime for insomnia and mood stabilization. Continue propranolol as needed for anxiety. Pt to follow-up in 3 months or sooner if clinically indicated.  Patient advised to contact office with any questions, adverse effects, or acute worsening in signs and symptoms.   Margeart was seen today for follow-up.  Diagnoses and all orders for this visit:  Attention deficit disorder (ADD) without hyperactivity -     methylphenidate (RITALIN) 20 MG tablet; Take 1 tablet (20 mg total) by mouth 2 (two) times daily. May take an additional 1/2 tab as needed -     methylphenidate (RITALIN) 20 MG tablet; Take 1 tablet (20 mg total) by mouth 2 (two) times daily with breakfast and lunch. May take an additional 1/2 tab as needed -     methylphenidate (RITALIN) 20 MG tablet; Take 1 tablet (20 mg total) by mouth 2 (two) times daily with breakfast and lunch. May take an additional 1/2 tab as needed  Social anxiety disorder -     busPIRone (BUSPAR) 15 MG tablet; Take 1 tablet (15 mg total) by mouth 2 (two) times daily.  Bipolar II disorder (HCC) -     lamoTRIgine (LAMICTAL) 150  MG tablet; Take 1 tablet (150 mg total) by mouth daily.  Please see After Visit Summary for patient specific instructions.  No future appointments.   No orders of the defined types were placed in this encounter.     -------------------------------

## 2022-01-24 ENCOUNTER — Telehealth: Payer: Self-pay | Admitting: Psychiatry

## 2022-01-24 ENCOUNTER — Other Ambulatory Visit: Payer: Self-pay

## 2022-01-24 NOTE — Telephone Encounter (Signed)
Next appt is 03/15/22. Requesting refill on Methylphenidate 20 mg called to:  [id number] [address], [place] AT [facility] [address]  Phone:  [phone]  Fax:  [phone]    Per patient it is in stock.

## 2022-01-24 NOTE — Telephone Encounter (Signed)
Notified patient that a RF is available at her pharmacy.

## 2022-03-03 DIAGNOSIS — R141 Gas pain: Secondary | ICD-10-CM | POA: Diagnosis not present

## 2022-03-03 DIAGNOSIS — Z8571 Personal history of Hodgkin lymphoma: Secondary | ICD-10-CM | POA: Diagnosis not present

## 2022-03-03 DIAGNOSIS — R0981 Nasal congestion: Secondary | ICD-10-CM | POA: Diagnosis not present

## 2022-03-03 DIAGNOSIS — M25511 Pain in right shoulder: Secondary | ICD-10-CM | POA: Diagnosis not present

## 2022-03-03 DIAGNOSIS — Z7689 Persons encountering health services in other specified circumstances: Secondary | ICD-10-CM | POA: Diagnosis not present

## 2022-03-04 DIAGNOSIS — R69 Illness, unspecified: Secondary | ICD-10-CM | POA: Diagnosis not present

## 2022-03-04 DIAGNOSIS — D649 Anemia, unspecified: Secondary | ICD-10-CM | POA: Diagnosis not present

## 2022-03-04 DIAGNOSIS — Z13220 Encounter for screening for lipoid disorders: Secondary | ICD-10-CM | POA: Diagnosis not present

## 2022-03-04 DIAGNOSIS — Z23 Encounter for immunization: Secondary | ICD-10-CM | POA: Diagnosis not present

## 2022-03-04 DIAGNOSIS — Z131 Encounter for screening for diabetes mellitus: Secondary | ICD-10-CM | POA: Diagnosis not present

## 2022-03-04 DIAGNOSIS — Z0000 Encounter for general adult medical examination without abnormal findings: Secondary | ICD-10-CM | POA: Diagnosis not present

## 2022-03-04 DIAGNOSIS — E559 Vitamin D deficiency, unspecified: Secondary | ICD-10-CM | POA: Diagnosis not present

## 2022-03-04 DIAGNOSIS — Z6827 Body mass index (BMI) 27.0-27.9, adult: Secondary | ICD-10-CM | POA: Diagnosis not present

## 2022-03-04 DIAGNOSIS — Z8571 Personal history of Hodgkin lymphoma: Secondary | ICD-10-CM | POA: Diagnosis not present

## 2022-03-08 DIAGNOSIS — M25511 Pain in right shoulder: Secondary | ICD-10-CM | POA: Diagnosis not present

## 2022-03-14 DIAGNOSIS — M542 Cervicalgia: Secondary | ICD-10-CM | POA: Diagnosis not present

## 2022-03-14 DIAGNOSIS — M79601 Pain in right arm: Secondary | ICD-10-CM | POA: Diagnosis not present

## 2022-03-22 ENCOUNTER — Telehealth: Payer: Self-pay | Admitting: Psychiatry

## 2022-03-22 ENCOUNTER — Other Ambulatory Visit: Payer: Self-pay

## 2022-03-22 DIAGNOSIS — F988 Other specified behavioral and emotional disorders with onset usually occurring in childhood and adolescence: Secondary | ICD-10-CM

## 2022-03-22 NOTE — Telephone Encounter (Signed)
Pended.

## 2022-03-22 NOTE — Telephone Encounter (Signed)
Pt needs a refill on her ritalin 20 mg. Pharmacy is [facility] on [address].Next appt is 11/29

## 2022-03-23 DIAGNOSIS — M542 Cervicalgia: Secondary | ICD-10-CM | POA: Diagnosis not present

## 2022-03-23 DIAGNOSIS — M6281 Muscle weakness (generalized): Secondary | ICD-10-CM | POA: Diagnosis not present

## 2022-03-23 DIAGNOSIS — R293 Abnormal posture: Secondary | ICD-10-CM | POA: Diagnosis not present

## 2022-03-23 DIAGNOSIS — M2569 Stiffness of other specified joint, not elsewhere classified: Secondary | ICD-10-CM | POA: Diagnosis not present

## 2022-03-23 MED ORDER — METHYLPHENIDATE HCL 20 MG PO TABS: 20.0000 mg | ORAL_TABLET | Freq: Two times a day (BID) | ORAL | 0 refills | Status: DC

## 2022-03-28 DIAGNOSIS — M2569 Stiffness of other specified joint, not elsewhere classified: Secondary | ICD-10-CM | POA: Diagnosis not present

## 2022-03-28 DIAGNOSIS — M6281 Muscle weakness (generalized): Secondary | ICD-10-CM | POA: Diagnosis not present

## 2022-03-28 DIAGNOSIS — M542 Cervicalgia: Secondary | ICD-10-CM | POA: Diagnosis not present

## 2022-03-28 DIAGNOSIS — R293 Abnormal posture: Secondary | ICD-10-CM | POA: Diagnosis not present

## 2022-03-30 ENCOUNTER — Telehealth (INDEPENDENT_AMBULATORY_CARE_PROVIDER_SITE_OTHER): Payer: 59 | Admitting: Psychiatry

## 2022-03-30 ENCOUNTER — Encounter: Payer: Self-pay | Admitting: Psychiatry

## 2022-03-30 DIAGNOSIS — F4010 Social phobia, unspecified: Secondary | ICD-10-CM | POA: Diagnosis not present

## 2022-03-30 DIAGNOSIS — R69 Illness, unspecified: Secondary | ICD-10-CM | POA: Diagnosis not present

## 2022-03-30 DIAGNOSIS — F3181 Bipolar II disorder: Secondary | ICD-10-CM | POA: Diagnosis not present

## 2022-03-30 DIAGNOSIS — F988 Other specified behavioral and emotional disorders with onset usually occurring in childhood and adolescence: Secondary | ICD-10-CM | POA: Diagnosis not present

## 2022-03-30 MED ORDER — METHYLPHENIDATE HCL 20 MG PO TABS: 20.0000 mg | ORAL_TABLET | Freq: Two times a day (BID) | ORAL | 0 refills | Status: DC

## 2022-03-30 MED ORDER — BUSPIRONE HCL 15 MG PO TABS: 15.0000 mg | ORAL_TABLET | Freq: Two times a day (BID) | ORAL | 1 refills | Status: DC

## 2022-03-30 MED ORDER — QUETIAPINE FUMARATE 100 MG PO TABS: ORAL_TABLET | ORAL | 1 refills | Status: DC

## 2022-03-30 MED ORDER — PROPRANOLOL HCL 10 MG PO TABS: ORAL_TABLET | ORAL | 1 refills | Status: DC

## 2022-03-30 MED ORDER — LAMOTRIGINE 150 MG PO TABS: 150.0000 mg | ORAL_TABLET | Freq: Every day | ORAL | 0 refills | Status: DC

## 2022-03-30 NOTE — Progress Notes (Signed)
Jessica Suarez [id number] 04/04/1981 41 y.o.  Virtual Visit via Video Note  I connected with Jessica Suarez @ on 03/30/22 at  1:45 PM EST by a video enabled telemedicine application and verified that I am speaking with the correct person using two identifiers.   I discussed the limitations of evaluation and management by telemedicine and the availability of in person appointments. The patient expressed understanding and agreed to proceed.  I discussed the assessment and treatment plan with the patient. The patient was provided an opportunity to ask questions and all were answered. The patient agreed with the plan and demonstrated an understanding of the instructions.   The patient was advised to call back or seek an in-person evaluation if the symptoms worsen or if the condition fails to improve as anticipated.  I provided 30 minutes of non-face-to-face time during this encounter.  The patient was located at home.  The provider was located at [facility].   Corie Chiquito, PMHNP   Subjective:   Patient ID:  Jessica Suarez is a 41 y.o. (DOB 1981/03/14) female.  Chief Complaint:  Chief Complaint  Patient presents with   Follow-up    Anxiety, mood disturbance, insomnia, and ADHD    HPI Jessica Suarez presents for follow-up of anxiety, Bipolar disorder, insomnia, and ADHD.   She reports that she saw a PCP.  She was found to have low Vit D and was slightly anemic. She reports that kidney and liver function was WNL. She was having shoulder pain and had an injection, which seemed to exacerbate her pain. 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 She was prescribed Prednisone and had several adverse effects ("emotional," face breaking out, menstrual changes, etc). She was started on a muscle relaxer for 14 days. She has also started Jessica Suarez twice a week. She reports that her pain is 60% improved. She has been using heat  alternating with ice.   She reports that her mood was stable prior to taking steroids. She reports that her sleep has been ok. She reports that Propranolol has been effective for her anxiety and that she did not have it available during a conference call and she received notifications that her HR has been elevated. She reports that she has some frustrations with husband not making some changes that she would like. She reports that her concentration has been ok as long as she is not distracted. Appetite has been increased with Prednisone. She reports that prior to Prednisone she was eating healthier foods. She reports that she had some manic symptoms with Prednisone. She reports that she has had some increased spending and is not sure if this is related to [facility] Friday sales. She reports that she has had some increased goal-directed activity and increased energy. She has also rearranged her closet.She reports that manic s/s seem to be resolving. Denies SI.   She could potentially be returning to third shift. 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   Completed Prednisone about a week ago.   Ritalin last filled 03/27/22  Past Medication Trials: Latuda- "did not like how it made me feel" Seroquel Wellbutrin-ineffective Zoloft Effexor Prozac-helpful for PMDD signs and symptoms Lexapro Provigil-not as helpful for daytime somnolence Methylphenidate BuSpar-helpful for anxiety Lamictal-helpful for mood Propranolol Guanfacine-ineffective  Review of Systems:  Review of Systems  Musculoskeletal:  Negative for gait problem.       Shoulder pain  Skin:        Recent acne  Neurological:  Negative for tremors.  Psychiatric/Behavioral:         Please refer to HPI    She reports that she has an upcoming mammogram and PAP smear.   Medications: I have reviewed the patient's current  medications.  Current Outpatient Medications  Medication Sig Dispense Refill   acetaminophen (TYLENOL) 325 MG tablet Take 650 mg by mouth every 6 (six) hours as needed.     cholecalciferol (VITAMIN D3) 25 MCG (1000 UNIT) tablet Take 1,000 Units by mouth daily.     cyclobenzaprine (FLEXERIL) 5 MG tablet Take 5 mg by mouth at bedtime as needed.     levocetirizine (XYZAL) 5 MG tablet Take 5 mg by mouth every evening.     busPIRone (BUSPAR) 15 MG tablet Take 1 tablet (15 mg total) by mouth 2 (two) times daily. 180 tablet 1   EPINEPHrine (EPIPEN 2-PAK) 0.3 mg/0.3 mL IJ SOAJ injection Inject into the muscle as needed (INJECTION PROTOCOL).     lamoTRIgine (LAMICTAL) 150 MG tablet Take 1 tablet (150 mg total) by mouth daily. 90 tablet 0   lidocaine (LIDODERM) 5 % Place 1 patch onto the skin daily. Remove & Discard patch within 12 hours or as directed by MD     [START ON 04/24/2022] methylphenidate (RITALIN) 20 MG tablet Take 1 tablet (20 mg total) by mouth 2 (two) times daily with breakfast and lunch. May take an additional 1/2 tab as needed 75 tablet 0   [START ON 05/22/2022] methylphenidate (RITALIN) 20 MG tablet Take 1 tablet (20 mg total) by mouth 2 (two) times daily. May take an additional 1/2 tab as needed 75 tablet 0   [START ON 06/19/2022] methylphenidate (RITALIN) 20 MG tablet Take 1 tablet (20 mg total) by mouth 2 (two) times daily with breakfast and lunch. May take an additional 1/2 tab as needed 75 tablet 0   propranolol (INDERAL) 10 MG tablet TAKE 1 TO 2 TABLETS BY MOUTH TWICE A DAY AS NEEDED FOR ANXIETY 360 tablet 1   QUEtiapine (SEROQUEL) 100 MG tablet TAKE 2-3 TABLETS BY MOUTH AT BEDTIME 270 tablet 1   No current facility-administered medications for this visit.    Medication Side Effects: None  Allergies: No Known Allergies  Past Medical History:  Diagnosis Date   Allergic rhinitis    Depression    History of Hodgkin's lymphoma MAY 2004   IBS (irritable bowel syndrome)     Migraines    Sleep disorder     Family History  Problem Relation Age of Onset   Hypertension Mother    Hypercholesterolemia Mother    Asthma Father    Diabetes Father    Asthma Brother    Bipolar disorder Brother    Suicidality Brother    Drug abuse Paternal Aunt    Aneurysm Paternal Aunt    Anxiety disorder Other    Cancer Paternal Aunt     Social History   Socioeconomic History   Marital status: Single    Spouse name: Not on file   Number of children: Not on file   Years of education: Not on file   Highest education level: Not on file  Occupational History   Not on file  Tobacco Use   Smoking status: Passive Smoke Exposure - Never Smoker   Smokeless tobacco: Never  Substance and Sexual Activity  Alcohol use: Yes    Alcohol/week: 2.0 standard drinks of alcohol    Types: 2 Shots of liquor per week   Drug use: No   Sexual activity: Yes    Birth control/protection: None  Other Topics Concern   Not on file  Social History Narrative   Not on file   Social Determinants of Health   Financial Resource Strain: Not on file  Food Insecurity: Not on file  Transportation Needs: Not on file  Physical Activity: Not on file  Stress: Not on file  Social Connections: Not on file  Intimate Partner Violence: Not on file    Past Medical History, Surgical history, Social history, and Family history were reviewed and updated as appropriate.   Please see review of systems for further details on the patient's review from today.   Objective:   Physical Exam:  BP 128/85   Pulse 85   Physical Exam Neurological:     Mental Status: She is alert and oriented to person, place, and time.     Cranial Nerves: No dysarthria.  Psychiatric:        Attention and Perception: Attention and perception normal.        Mood and Affect: Mood normal.        Speech: Speech normal.        Behavior: Behavior is cooperative.        Thought Content: Thought content normal. Thought content is  not paranoid or delusional. Thought content does not include homicidal or suicidal ideation. Thought content does not include homicidal or suicidal plan.        Cognition and Memory: Cognition and memory normal.        Judgment: Judgment normal.     Comments: Insight intact     Lab Review:  No results found for: "NA", "K", "CL", "CO2", "GLUCOSE", "BUN", "CREATININE", "CALCIUM", "PROT", "ALBUMIN", "AST", "ALT", "ALKPHOS", "BILITOT", "GFRNONAA", "GFRAA"  No results found for: "WBC", "RBC", "HGB", "HCT", "PLT", "MCV", "MCH", "MCHC", "RDW", "LYMPHSABS", "MONOABS", "EOSABS", "BASOSABS"  No results found for: "POCLITH", "LITHIUM"   No results found for: "PHENYTOIN", "PHENOBARB", "VALPROATE", "CBMZ"   .res Assessment: Plan:    Jessica Suarez seen for 30 minutes and time spent reviewing treatment plan and recent psychosocial and medical changes. She reports that she would like to continue current medications without changes at this time. She asks about what medication adjustments may be helpful in the future if she is prescribed prednisone again and experienced hypomania. Discussed that additional Seroquel may be helpful for hypomanic symptoms.  Continue BuSpar 15 mg twice daily for anxiety. Continue lamotrigine 150 mg daily for mood stabilization. Continue Seroquel 100 mg 1 to 3 tablets at bedtime for insomnia and mood stabilization. Continue propranolol as needed for anxiety. Continue Ritalin 20 mg po BID and additional half tablet as needed.  Jessica Suarez to follow-up in 3 months or sooner if clinically indicated.  Patient advised to contact office with any questions, adverse effects, or acute worsening in signs and symptoms.   Jessica Suarez was seen today for follow-up.  Diagnoses and all orders for this visit:  Social anxiety disorder -     busPIRone (BUSPAR) 15 MG tablet; Take 1 tablet (15 mg total) by mouth 2 (two) times daily. -     propranolol (INDERAL) 10 MG tablet; TAKE 1 TO 2 TABLETS BY MOUTH TWICE A DAY  AS NEEDED FOR ANXIETY  Attention deficit disorder (ADD) without hyperactivity -     methylphenidate (RITALIN) 20 MG tablet; Take  1 tablet (20 mg total) by mouth 2 (two) times daily with breakfast and lunch. May take an additional 1/2 tab as needed -     methylphenidate (RITALIN) 20 MG tablet; Take 1 tablet (20 mg total) by mouth 2 (two) times daily. May take an additional 1/2 tab as needed -     methylphenidate (RITALIN) 20 MG tablet; Take 1 tablet (20 mg total) by mouth 2 (two) times daily with breakfast and lunch. May take an additional 1/2 tab as needed  Bipolar II disorder ([place]) -     QUEtiapine (SEROQUEL) 100 MG tablet; TAKE 2-3 TABLETS BY MOUTH AT BEDTIME -     lamoTRIgine (LAMICTAL) 150 MG tablet; Take 1 tablet (150 mg total) by mouth daily.     Please see After Visit Summary for patient specific instructions.  No future appointments.  No orders of the defined types were placed in this encounter.     -------------------------------

## 2022-03-31 DIAGNOSIS — R293 Abnormal posture: Secondary | ICD-10-CM | POA: Diagnosis not present

## 2022-03-31 DIAGNOSIS — M2569 Stiffness of other specified joint, not elsewhere classified: Secondary | ICD-10-CM | POA: Diagnosis not present

## 2022-03-31 DIAGNOSIS — M542 Cervicalgia: Secondary | ICD-10-CM | POA: Diagnosis not present

## 2022-03-31 DIAGNOSIS — M6281 Muscle weakness (generalized): Secondary | ICD-10-CM | POA: Diagnosis not present

## 2022-04-06 ENCOUNTER — Other Ambulatory Visit: Payer: Self-pay | Admitting: Sports Medicine

## 2022-04-06 DIAGNOSIS — M542 Cervicalgia: Secondary | ICD-10-CM

## 2022-04-11 DIAGNOSIS — M6281 Muscle weakness (generalized): Secondary | ICD-10-CM | POA: Diagnosis not present

## 2022-04-11 DIAGNOSIS — M2569 Stiffness of other specified joint, not elsewhere classified: Secondary | ICD-10-CM | POA: Diagnosis not present

## 2022-04-11 DIAGNOSIS — R293 Abnormal posture: Secondary | ICD-10-CM | POA: Diagnosis not present

## 2022-04-11 DIAGNOSIS — M542 Cervicalgia: Secondary | ICD-10-CM | POA: Diagnosis not present

## 2022-04-14 DIAGNOSIS — M5412 Radiculopathy, cervical region: Secondary | ICD-10-CM | POA: Diagnosis not present

## 2022-04-15 DIAGNOSIS — M542 Cervicalgia: Secondary | ICD-10-CM | POA: Diagnosis not present

## 2022-04-15 DIAGNOSIS — R293 Abnormal posture: Secondary | ICD-10-CM | POA: Diagnosis not present

## 2022-04-15 DIAGNOSIS — M6281 Muscle weakness (generalized): Secondary | ICD-10-CM | POA: Diagnosis not present

## 2022-04-15 DIAGNOSIS — M2569 Stiffness of other specified joint, not elsewhere classified: Secondary | ICD-10-CM | POA: Diagnosis not present

## 2022-04-18 DIAGNOSIS — M6281 Muscle weakness (generalized): Secondary | ICD-10-CM | POA: Diagnosis not present

## 2022-04-18 DIAGNOSIS — R293 Abnormal posture: Secondary | ICD-10-CM | POA: Diagnosis not present

## 2022-04-18 DIAGNOSIS — M542 Cervicalgia: Secondary | ICD-10-CM | POA: Diagnosis not present

## 2022-04-18 DIAGNOSIS — M2569 Stiffness of other specified joint, not elsewhere classified: Secondary | ICD-10-CM | POA: Diagnosis not present

## 2022-04-19 DIAGNOSIS — M5412 Radiculopathy, cervical region: Secondary | ICD-10-CM | POA: Diagnosis not present

## 2022-04-19 DIAGNOSIS — R293 Abnormal posture: Secondary | ICD-10-CM | POA: Diagnosis not present

## 2022-04-19 DIAGNOSIS — M542 Cervicalgia: Secondary | ICD-10-CM | POA: Diagnosis not present

## 2022-04-19 DIAGNOSIS — M6281 Muscle weakness (generalized): Secondary | ICD-10-CM | POA: Diagnosis not present

## 2022-04-21 DIAGNOSIS — M2569 Stiffness of other specified joint, not elsewhere classified: Secondary | ICD-10-CM | POA: Diagnosis not present

## 2022-04-21 DIAGNOSIS — M6281 Muscle weakness (generalized): Secondary | ICD-10-CM | POA: Diagnosis not present

## 2022-04-21 DIAGNOSIS — M542 Cervicalgia: Secondary | ICD-10-CM | POA: Diagnosis not present

## 2022-04-21 DIAGNOSIS — R293 Abnormal posture: Secondary | ICD-10-CM | POA: Diagnosis not present

## 2022-04-27 DIAGNOSIS — M2569 Stiffness of other specified joint, not elsewhere classified: Secondary | ICD-10-CM | POA: Diagnosis not present

## 2022-04-27 DIAGNOSIS — M542 Cervicalgia: Secondary | ICD-10-CM | POA: Diagnosis not present

## 2022-04-27 DIAGNOSIS — M6281 Muscle weakness (generalized): Secondary | ICD-10-CM | POA: Diagnosis not present

## 2022-04-27 DIAGNOSIS — R293 Abnormal posture: Secondary | ICD-10-CM | POA: Diagnosis not present

## 2022-04-30 DIAGNOSIS — R293 Abnormal posture: Secondary | ICD-10-CM | POA: Diagnosis not present

## 2022-04-30 DIAGNOSIS — M2569 Stiffness of other specified joint, not elsewhere classified: Secondary | ICD-10-CM | POA: Diagnosis not present

## 2022-04-30 DIAGNOSIS — M542 Cervicalgia: Secondary | ICD-10-CM | POA: Diagnosis not present

## 2022-04-30 DIAGNOSIS — M6281 Muscle weakness (generalized): Secondary | ICD-10-CM | POA: Diagnosis not present

## 2022-05-03 DIAGNOSIS — M6281 Muscle weakness (generalized): Secondary | ICD-10-CM | POA: Diagnosis not present

## 2022-05-03 DIAGNOSIS — M542 Cervicalgia: Secondary | ICD-10-CM | POA: Diagnosis not present

## 2022-05-03 DIAGNOSIS — M2569 Stiffness of other specified joint, not elsewhere classified: Secondary | ICD-10-CM | POA: Diagnosis not present

## 2022-05-03 DIAGNOSIS — R293 Abnormal posture: Secondary | ICD-10-CM | POA: Diagnosis not present

## 2022-05-06 DIAGNOSIS — M6281 Muscle weakness (generalized): Secondary | ICD-10-CM | POA: Diagnosis not present

## 2022-05-06 DIAGNOSIS — R293 Abnormal posture: Secondary | ICD-10-CM | POA: Diagnosis not present

## 2022-05-06 DIAGNOSIS — M2569 Stiffness of other specified joint, not elsewhere classified: Secondary | ICD-10-CM | POA: Diagnosis not present

## 2022-05-06 DIAGNOSIS — M542 Cervicalgia: Secondary | ICD-10-CM | POA: Diagnosis not present

## 2022-05-09 ENCOUNTER — Telehealth: Payer: Self-pay

## 2022-05-09 DIAGNOSIS — M2569 Stiffness of other specified joint, not elsewhere classified: Secondary | ICD-10-CM | POA: Diagnosis not present

## 2022-05-09 DIAGNOSIS — R293 Abnormal posture: Secondary | ICD-10-CM | POA: Diagnosis not present

## 2022-05-09 DIAGNOSIS — M6281 Muscle weakness (generalized): Secondary | ICD-10-CM | POA: Diagnosis not present

## 2022-05-09 DIAGNOSIS — M542 Cervicalgia: Secondary | ICD-10-CM | POA: Diagnosis not present

## 2022-05-09 NOTE — Telephone Encounter (Addendum)
Prior Authorization Methylphenidate HCl 20MG  tablets #75 [employer]   Approved Effective:  05/09/22-05/10/23

## 2022-05-11 DIAGNOSIS — M6281 Muscle weakness (generalized): Secondary | ICD-10-CM | POA: Diagnosis not present

## 2022-05-11 DIAGNOSIS — M542 Cervicalgia: Secondary | ICD-10-CM | POA: Diagnosis not present

## 2022-05-11 DIAGNOSIS — M2569 Stiffness of other specified joint, not elsewhere classified: Secondary | ICD-10-CM | POA: Diagnosis not present

## 2022-05-11 DIAGNOSIS — R293 Abnormal posture: Secondary | ICD-10-CM | POA: Diagnosis not present

## 2022-05-19 DIAGNOSIS — R293 Abnormal posture: Secondary | ICD-10-CM | POA: Diagnosis not present

## 2022-05-19 DIAGNOSIS — M2569 Stiffness of other specified joint, not elsewhere classified: Secondary | ICD-10-CM | POA: Diagnosis not present

## 2022-05-19 DIAGNOSIS — M542 Cervicalgia: Secondary | ICD-10-CM | POA: Diagnosis not present

## 2022-05-19 DIAGNOSIS — M6281 Muscle weakness (generalized): Secondary | ICD-10-CM | POA: Diagnosis not present

## 2022-05-20 DIAGNOSIS — M6281 Muscle weakness (generalized): Secondary | ICD-10-CM | POA: Diagnosis not present

## 2022-05-20 DIAGNOSIS — M542 Cervicalgia: Secondary | ICD-10-CM | POA: Diagnosis not present

## 2022-05-20 DIAGNOSIS — M5412 Radiculopathy, cervical region: Secondary | ICD-10-CM | POA: Diagnosis not present

## 2022-05-20 DIAGNOSIS — R293 Abnormal posture: Secondary | ICD-10-CM | POA: Diagnosis not present

## 2022-05-27 DIAGNOSIS — M542 Cervicalgia: Secondary | ICD-10-CM | POA: Diagnosis not present

## 2022-05-27 DIAGNOSIS — M2569 Stiffness of other specified joint, not elsewhere classified: Secondary | ICD-10-CM | POA: Diagnosis not present

## 2022-05-27 DIAGNOSIS — R293 Abnormal posture: Secondary | ICD-10-CM | POA: Diagnosis not present

## 2022-05-27 DIAGNOSIS — M6281 Muscle weakness (generalized): Secondary | ICD-10-CM | POA: Diagnosis not present

## 2022-05-30 ENCOUNTER — Telehealth: Payer: Self-pay | Admitting: Psychiatry

## 2022-05-30 ENCOUNTER — Other Ambulatory Visit: Payer: Self-pay

## 2022-05-30 DIAGNOSIS — F988 Other specified behavioral and emotional disorders with onset usually occurring in childhood and adolescence: Secondary | ICD-10-CM

## 2022-05-30 MED ORDER — METHYLPHENIDATE HCL 20 MG PO TABS: 20.0000 mg | ORAL_TABLET | Freq: Two times a day (BID) | ORAL | 0 refills | Status: DC

## 2022-05-30 NOTE — Telephone Encounter (Signed)
[facility] [address] has in stock. Rx pended. Canceled Rx at [facility].

## 2022-05-30 NOTE — Telephone Encounter (Signed)
Jessica Suarez called and LM at 9:49 to report that her Ritalin is on backorder. She can't find it and wants to know if there is another medication or what are other options since she can't get this medication.  Please call.  Appt 2/12

## 2022-06-13 ENCOUNTER — Encounter: Payer: Self-pay | Admitting: Psychiatry

## 2022-06-13 ENCOUNTER — Ambulatory Visit (INDEPENDENT_AMBULATORY_CARE_PROVIDER_SITE_OTHER): Payer: 59 | Admitting: Psychiatry

## 2022-06-13 DIAGNOSIS — F988 Other specified behavioral and emotional disorders with onset usually occurring in childhood and adolescence: Secondary | ICD-10-CM

## 2022-06-13 DIAGNOSIS — F3181 Bipolar II disorder: Secondary | ICD-10-CM | POA: Diagnosis not present

## 2022-06-13 DIAGNOSIS — F4010 Social phobia, unspecified: Secondary | ICD-10-CM | POA: Diagnosis not present

## 2022-06-13 MED ORDER — QUETIAPINE FUMARATE 100 MG PO TABS: ORAL_TABLET | ORAL | 1 refills | Status: DC

## 2022-06-13 MED ORDER — BUSPIRONE HCL 15 MG PO TABS: 15.0000 mg | ORAL_TABLET | Freq: Two times a day (BID) | ORAL | 1 refills | Status: DC

## 2022-06-13 MED ORDER — METHYLPHENIDATE HCL 20 MG PO TABS: 20.0000 mg | ORAL_TABLET | Freq: Two times a day (BID) | ORAL | 0 refills | Status: DC

## 2022-06-13 MED ORDER — LAMOTRIGINE 150 MG PO TABS: 150.0000 mg | ORAL_TABLET | Freq: Every day | ORAL | 0 refills | Status: DC

## 2022-06-13 NOTE — Progress Notes (Signed)
Jessica Suarez:[id number] April 09, 1981 42 y.o.  Subjective:   Patient ID:  Jessica Suarez is a 42 y.o. (DOB 05/08/1980) female.  Chief Complaint:  Chief Complaint  Patient presents with   Follow-up    Anxiety, mood disturbance, ADHD, and insomnia    HPI Jessica Suarez presents to the office today for follow-up of ADHD, Bipolar Disorder, and anxiety. She reports that she has had some situational stressors. Anxiety is occasionally increased in response to stressors. She reports that Seroquel is helpful for her anxiety, however she notices some excessive somnolence the following day. Some situational sadness after difficult conversation with husband. She reports that depression typically does not last longer than a few days. She reports that she will sometimes withdrawal briefly to process events. Denies severe irritability without trigger. Denies any manic s/s to include excessive spending. Some excessive cleaning when anxious. She reports sleeping 6 hours consistently and occasionally 7 hours. Energy and motivation have been ok. Concentration and focus have been up and down with determining ideal dose administration time of Ritalin following schedule change. She has shared work space during renovations and this creates some distractions and she is unable to speak out loud to help process information. She is also working on eating schedule. Denies SI.   She has been doing PT and reports that this is going well. Has mammogram and Pap smear this Wednesday. She reports that she has been "getting caught up" on health screenings.   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 She is adjusting to new work hours. She is going into work around 3:30-4 am. She will receive phone calls at various hours.  Works Tuesday- Friday.   She reports that she will take different amounts of Seroquel based on the amount of sleep she is able to get with her work hours.   Ritalin last filled 05/30/22.  Past  Medication Trials: Latuda- "did not like how it made me feel" Seroquel Wellbutrin-ineffective Zoloft Effexor Prozac-helpful for PMDD signs and symptoms Lexapro Provigil-not as helpful for daytime somnolence Methylphenidate BuSpar-helpful for anxiety Lamictal-helpful for mood Propranolol Guanfacine-ineffective  [facility] Office Visit from 06/13/2022 in [facility] Video Visit from 03/30/2022 in [facility] Video Visit from 12/13/2021 in [facility] Office Visit from 06/18/2021 in [facility] Office Visit from 12/10/2019 in [facility]  AIMS Total Score 0 0 0 0 0        Review of Systems:  Review of Systems  Musculoskeletal:  Negative for gait problem.       Improved shoulder pain  Neurological:  Negative for tremors.  Psychiatric/Behavioral:         Please refer to HPI    Medications: I have reviewed the patient's current medications.  Current Outpatient Medications  Medication Sig Dispense Refill   cholecalciferol (VITAMIN D3) 25 MCG (1000 UNIT) tablet Take 1,000 Units by mouth daily.     diclofenac (VOLTAREN) 50 MG EC tablet as needed.     levocetirizine (XYZAL) 5 MG tablet Take 5 mg by mouth every evening.     lidocaine (LIDODERM) 5 % Place 1 patch onto the skin daily. Remove & Discard patch within 12 hours or as directed by MD     propranolol (INDERAL) 10 MG tablet TAKE 1 TO 2 TABLETS BY MOUTH TWICE A DAY AS NEEDED FOR ANXIETY 360 tablet 1   acetaminophen (TYLENOL) 325 MG tablet  Take 650 mg by mouth every 6 (six) hours as needed.     busPIRone (BUSPAR) 15 MG tablet Take 1 tablet (15 mg total) by mouth 2 (two) times daily. 180 tablet 1   cyclobenzaprine (FLEXERIL) 5 MG tablet Take 5 mg by mouth at bedtime as needed. (Patient not taking: Reported on 06/13/2022)     EPINEPHrine (EPIPEN 2-PAK) 0.3 mg/0.3 mL IJ SOAJ injection  Inject into the muscle as needed (INJECTION PROTOCOL).     lamoTRIgine (LAMICTAL) 150 MG tablet Take 1 tablet (150 mg total) by mouth daily. 90 tablet 0   [START ON 06/19/2022] methylphenidate (RITALIN) 20 MG tablet Take 1 tablet (20 mg total) by mouth 2 (two) times daily with breakfast and lunch. May take an additional 1/2 tab as needed 75 tablet 0   [START ON 07/25/2022] methylphenidate (RITALIN) 20 MG tablet Take 1 tablet (20 mg total) by mouth 2 (two) times daily with breakfast and lunch. May take an additional 1/2 tab as needed 75 tablet 0   [START ON 08/22/2022] methylphenidate (RITALIN) 20 MG tablet Take 1 tablet (20 mg total) by mouth 2 (two) times daily. May take an additional 1/2 tab as needed 75 tablet 0   QUEtiapine (SEROQUEL) 100 MG tablet TAKE 2-3 TABLETS BY MOUTH AT BEDTIME 270 tablet 1   No current facility-administered medications for this visit.    Medication Side Effects: None  Allergies: No Known Allergies  Past Medical History:  Diagnosis Date   Allergic rhinitis    Depression    History of Hodgkin's lymphoma MAY 2004   IBS (irritable bowel syndrome)    Migraines    Sleep disorder     Past Medical History, Surgical history, Social history, and Family history were reviewed and updated as appropriate.   Please see review of systems for further details on the patient's review from today.   Objective:   Physical Exam:  BP (!) 131/96   Pulse 98   Physical Exam Constitutional:      General: She is not in acute distress. Musculoskeletal:        General: No deformity.  Neurological:     Mental Status: She is alert and oriented to person, place, and time.     Coordination: Coordination normal.  Psychiatric:        Attention and Perception: Attention and perception normal. She does not perceive auditory or visual hallucinations.        Mood and Affect: Mood is not depressed. Affect is not labile, blunt, angry or inappropriate.        Speech: Speech normal.         Behavior: Behavior normal.        Thought Content: Thought content normal. Thought content is not paranoid or delusional. Thought content does not include homicidal or suicidal ideation. Thought content does not include homicidal or suicidal plan.        Cognition and Memory: Cognition and memory normal.        Judgment: Judgment normal.     Comments: Insight intact Anxious in response to stressors     Lab Review:  No results found for: "NA", "K", "CL", "CO2", "GLUCOSE", "BUN", "CREATININE", "CALCIUM", "PROT", "ALBUMIN", "AST", "ALT", "ALKPHOS", "BILITOT", "GFRNONAA", "GFRAA"  No results found for: "WBC", "RBC", "HGB", "HCT", "PLT", "MCV", "MCH", "MCHC", "RDW", "LYMPHSABS", "MONOABS", "EOSABS", "BASOSABS"  No results found for: "POCLITH", "LITHIUM"   No results found for: "PHENYTOIN", "PHENOBARB", "VALPROATE", "CBMZ"   .res Assessment: Plan:   Will continue current  plan of care since target signs and symptoms are well controlled without any tolerability issues. Continue Ritalin 20 mg po BID and an additional 10 mg daily as needed for ADHD. Discussed recent stimulant shortage issues. Advised pt to continue to notify office if pharmacy does not have medication in stock and script can be sent to pharmacy where it is available.  Continue Buspar 15 mg po BID for anxiety.  Continue Lamictal 150 mg po qd for mood stabilization.  Continue Propranolol 10 mg 1-2 tabs po BID prn anxiety.  Continue Seroquel 100 mg 2-3 tabs po QHS for mood stabilization, anxiety, and insomnia.  Pt to follow-up in 3 months or sooner if clinically indicated.  Patient advised to contact office with any questions, adverse effects, or acute worsening in signs and symptoms.   Zihan was seen today for follow-up.  Diagnoses and all orders for this visit:  Social anxiety disorder -     busPIRone (BUSPAR) 15 MG tablet; Take 1 tablet (15 mg total) by mouth 2 (two) times daily.  Attention deficit disorder (ADD) without  hyperactivity -     methylphenidate (RITALIN) 20 MG tablet; Take 1 tablet (20 mg total) by mouth 2 (two) times daily with breakfast and lunch. May take an additional 1/2 tab as needed -     methylphenidate (RITALIN) 20 MG tablet; Take 1 tablet (20 mg total) by mouth 2 (two) times daily. May take an additional 1/2 tab as needed  Bipolar II disorder ([place]) -     QUEtiapine (SEROQUEL) 100 MG tablet; TAKE 2-3 TABLETS BY MOUTH AT BEDTIME -     lamoTRIgine (LAMICTAL) 150 MG tablet; Take 1 tablet (150 mg total) by mouth daily.     Please see After Visit Summary for patient specific instructions.  Future Appointments  Date Time Provider [place]  09/12/2022  8:30 AM Thayer Headings, PMHNP CP-[facility] None     No orders of the defined types were placed in this encounter.   -------------------------------

## 2022-06-20 ENCOUNTER — Other Ambulatory Visit: Payer: Self-pay | Admitting: Obstetrics and Gynecology

## 2022-06-20 DIAGNOSIS — N950 Postmenopausal bleeding: Secondary | ICD-10-CM

## 2022-06-28 ENCOUNTER — Telehealth: Payer: Self-pay | Admitting: Psychiatry

## 2022-06-28 ENCOUNTER — Other Ambulatory Visit: Payer: Self-pay

## 2022-06-28 DIAGNOSIS — F988 Other specified behavioral and emotional disorders with onset usually occurring in childhood and adolescence: Secondary | ICD-10-CM

## 2022-06-28 NOTE — Telephone Encounter (Signed)
Pended.

## 2022-06-28 NOTE — Telephone Encounter (Signed)
Pt LVM @ 2:33p.  She said she needs the Methylphenidate sent to [facility] [address].  It is available there.  Next appt 5/13

## 2022-06-28 NOTE — Telephone Encounter (Signed)
Cancel at [facility], [place] - [place] AT [facility] OF [place] [place], [place] [address] Phone: [phone]

## 2022-06-29 MED ORDER — METHYLPHENIDATE HCL 20 MG PO TABS: 20.0000 mg | ORAL_TABLET | Freq: Two times a day (BID) | ORAL | 0 refills | Status: DC

## 2022-08-08 ENCOUNTER — Other Ambulatory Visit: Payer: Managed Care, Other (non HMO)

## 2022-09-05 ENCOUNTER — Inpatient Hospital Stay: Admission: RE | Admit: 2022-09-05 | Payer: 59 | Source: Ambulatory Visit

## 2022-09-12 ENCOUNTER — Ambulatory Visit (INDEPENDENT_AMBULATORY_CARE_PROVIDER_SITE_OTHER): Payer: 59 | Admitting: Psychiatry

## 2022-09-12 ENCOUNTER — Encounter: Payer: Self-pay | Admitting: Psychiatry

## 2022-09-12 DIAGNOSIS — F3181 Bipolar II disorder: Secondary | ICD-10-CM | POA: Diagnosis not present

## 2022-09-12 DIAGNOSIS — F4010 Social phobia, unspecified: Secondary | ICD-10-CM

## 2022-09-12 DIAGNOSIS — F988 Other specified behavioral and emotional disorders with onset usually occurring in childhood and adolescence: Secondary | ICD-10-CM

## 2022-09-12 MED ORDER — LAMOTRIGINE 150 MG PO TABS: 150.0000 mg | ORAL_TABLET | Freq: Every day | ORAL | 1 refills | Status: DC

## 2022-09-12 MED ORDER — QUETIAPINE FUMARATE 50 MG PO TABS: ORAL_TABLET | ORAL | 1 refills | Status: DC

## 2022-09-12 MED ORDER — METHYLPHENIDATE HCL 20 MG PO TABS: 20.0000 mg | ORAL_TABLET | Freq: Two times a day (BID) | ORAL | 0 refills | Status: DC

## 2022-09-12 MED ORDER — BUSPIRONE HCL 15 MG PO TABS: 15.0000 mg | ORAL_TABLET | Freq: Two times a day (BID) | ORAL | 1 refills | Status: DC

## 2022-09-12 MED ORDER — PROPRANOLOL HCL 10 MG PO TABS: ORAL_TABLET | ORAL | 1 refills | Status: AC

## 2022-09-12 NOTE — Progress Notes (Signed)
Jessica Suarez [id number] 05-16-1980 42 y.o.  Subjective:   Patient ID:  Jessica Suarez is a 42 y.o. (DOB Jan 31, 1981) female.  Chief Complaint:  Chief Complaint  Patient presents with   Anxiety    Anxiety     Jessica Suarez presents to the office today for follow-up of ADHD, anxiety, and Bipolar Disorder.   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 She has some anxiety about supervisor returning and fear of him not agreeing with some of her decisions. She reports that she has been working 12-hour days, 6 days a week, for the last 3 weeks while supervisor has been on leave. She reports that she had some panic after learning she would need to make a major presentation in supervisor absence. "I have been trying to maintain my calm at work."  She reports that she had some irritation with husband recently "on top of what I am trying to juggle." She reports that she has been trying to take more Seroquel when she is able. She reports some difficulty knowing when to take Ritalin during the last few weeks while working longer and varied hours. She reports that concentration was ok- "I did the best I could." She worked at home more in the last few weeks to minimize distractions and interruptions. "I think I have been doing pretty good with my depression." Slept well over the weekend while at the beach. She reports that she often is sleeping about 4 hours due to working recent long hours. Energy and motivation have been low- "I'm exhausted." She missed some meals with increased work demands the last few weeks. Denies any impulsivity or risky behavior. Denies excessive spending. Denies SI.   Ritalin last filled 08/29/22.   Past Medication Trials: Latuda- "did not like how it made me  feel" Seroquel Wellbutrin-ineffective Zoloft Effexor Prozac-helpful for PMDD signs and symptoms Lexapro Provigil-not as helpful for daytime somnolence Methylphenidate BuSpar-helpful for anxiety Lamictal-helpful for mood Propranolol Guanfacine-ineffective    AIMS    Flowsheet Row Office Visit from 09/12/2022 in [facility] Office Visit from 06/13/2022 in [facility] Video Visit from 03/30/2022 in [facility] Video Visit from 12/13/2021 in [facility] Office Visit from 06/18/2021 in [facility]  AIMS Total Score 0 0 0 0 0        Review of Systems:  Review of Systems  Genitourinary:        Amenorrhea. Ultrasound has been ordered.  Musculoskeletal:  Negative for gait problem.  Psychiatric/Behavioral:         Please refer to HPI   Had mammogram and visit to eye specialist and these were "fine."   Medications: I have reviewed the patient's current medications.  Current Outpatient Medications  Medication Sig Dispense Refill   acetaminophen (TYLENOL) 325 MG tablet Take 650 mg by mouth every 6 (six) hours as needed.     busPIRone (BUSPAR) 15 MG tablet Take 1 tablet (15 mg total) by mouth 2 (two) times daily. 180 tablet 1   cholecalciferol (VITAMIN D3) 25 MCG (1000 UNIT) tablet Take 1,000 Units by mouth daily.     cyclobenzaprine (FLEXERIL) 5 MG tablet Take 5 mg by mouth at bedtime as needed. (Patient not taking: Reported on  06/13/2022)     diclofenac (VOLTAREN) 50 MG EC tablet as needed.     EPINEPHrine (EPIPEN 2-PAK) 0.3 mg/0.3 mL IJ SOAJ injection Inject into the muscle as needed (INJECTION PROTOCOL).     lamoTRIgine (LAMICTAL) 150 MG tablet Take 1 tablet (150 mg total) by mouth daily. 90 tablet 1   levocetirizine (XYZAL) 5 MG tablet Take 5 mg by mouth every evening.     lidocaine (LIDODERM) 5 % Place 1 patch onto the skin daily. Remove &  Discard patch within 12 hours or as directed by MD     methylphenidate (RITALIN) 20 MG tablet Take 1 tablet (20 mg total) by mouth 2 (two) times daily with breakfast and lunch. May take an additional 1/2 tab as needed 75 tablet 0   methylphenidate (RITALIN) 20 MG tablet Take 1 tablet (20 mg total) by mouth 2 (two) times daily. May take an additional 1/2 tab as needed 75 tablet 0   methylphenidate (RITALIN) 20 MG tablet Take 1 tablet (20 mg total) by mouth 2 (two) times daily with breakfast and lunch. May take an additional 1/2 tab as needed 75 tablet 0   propranolol (INDERAL) 10 MG tablet TAKE 1 TO 2 TABLETS BY MOUTH TWICE A DAY AS NEEDED FOR ANXIETY 360 tablet 1   QUEtiapine (SEROQUEL) 50 MG tablet TAKE 1-2 TABLETS BY MOUTH AT BEDTIME 180 tablet 1   No current facility-administered medications for this visit.    Medication Side Effects: None  Allergies: No Known Allergies  Past Medical History:  Diagnosis Date   Allergic rhinitis    Depression    History of Hodgkin's lymphoma MAY 2004   IBS (irritable bowel syndrome)    Migraines    Sleep disorder     Past Medical History, Surgical history, Social history, and Family history were reviewed and updated as appropriate.   Please see review of systems for further details on the patient's review from today.   Objective:   Physical Exam:  BP 127/88   Pulse 99   Physical Exam Constitutional:      General: She is not in acute distress. Musculoskeletal:        General: No deformity.  Neurological:     Mental Status: She is alert and oriented to person, place, and time.     Coordination: Coordination normal.  Psychiatric:        Attention and Perception: Attention and perception normal. She does not perceive auditory or visual hallucinations.        Mood and Affect: Mood is anxious. Mood is not depressed. Affect is not labile, blunt, angry or inappropriate.        Speech: Speech normal.        Behavior: Behavior normal.         Thought Content: Thought content normal. Thought content is not paranoid or delusional. Thought content does not include homicidal or suicidal ideation. Thought content does not include homicidal or suicidal plan.        Cognition and Memory: Cognition and memory normal.        Judgment: Judgment normal.     Comments: Insight intact     Lab Review:  No results found for: "NA", "K", "CL", "CO2", "GLUCOSE", "BUN", "CREATININE", "CALCIUM", "PROT", "ALBUMIN", "AST", "ALT", "ALKPHOS", "BILITOT", "GFRNONAA", "GFRAA"  No results found for: "WBC", "RBC", "HGB", "HCT", "PLT", "MCV", "MCH", "MCHC", "RDW", "LYMPHSABS", "MONOABS", "EOSABS", "BASOSABS"  No results found for: "POCLITH", "LITHIUM"   No results found for: "PHENYTOIN", "PHENOBARB", "VALPROATE", "  CBMZ"   .res Assessment: Plan:    Will change Seroquel to 50 mg 1-2 tabs po QHS since pt reports that she is typically taking 1/2 of a Seroquel 100 mg tablet since she sleeps excessively and may miss work if she takes more.  Continue Buspar 15 mg po BID for anxiety.  Continue Lamictal 150 mg po qd for mood stabilization.  Continue Propranolol 10 mg 1-2 tabs po BID prn anxiety.  Continue Seroquel 100 mg 2-3 tabs po QHS for mood stabilization, anxiety, and insomnia.  Continue Lamictal 150 mg po qd for mood symptoms.  Continue Ritalin 20 mg po BID and 1/2 tab as needed. Pt to follow-up in 3 months or sooner if clinically indicated.  Patient advised to contact office with any questions, adverse effects, or acute worsening in signs and symptoms.   Kaede was seen today for anxiety.  Diagnoses and all orders for this visit:  Bipolar II disorder (HCC) -     QUEtiapine (SEROQUEL) 50 MG tablet; TAKE 1-2 TABLETS BY MOUTH AT BEDTIME -     lamoTRIgine (LAMICTAL) 150 MG tablet; Take 1 tablet (150 mg total) by mouth daily.  Social anxiety disorder -     busPIRone (BUSPAR) 15 MG tablet; Take 1 tablet (15 mg total) by mouth 2 (two) times daily. -      propranolol (INDERAL) 10 MG tablet; TAKE 1 TO 2 TABLETS BY MOUTH TWICE A DAY AS NEEDED FOR ANXIETY  Attention deficit disorder (ADD) without hyperactivity -     methylphenidate (RITALIN) 20 MG tablet; Take 1 tablet (20 mg total) by mouth 2 (two) times daily with breakfast and lunch. May take an additional 1/2 tab as needed     Please see After Visit Summary for patient specific instructions.  Future Appointments  Date Time Provider Department Center  12/12/2022  8:30 AM Corie Chiquito, PMHNP CP-CP None     No orders of the defined types were placed in this encounter.   -------------------------------

## 2022-10-26 ENCOUNTER — Telehealth: Payer: Self-pay | Admitting: Psychiatry

## 2022-10-26 DIAGNOSIS — F988 Other specified behavioral and emotional disorders with onset usually occurring in childhood and adolescence: Secondary | ICD-10-CM

## 2022-10-26 MED ORDER — METHYLPHENIDATE HCL 20 MG PO TABS: 20.0000 mg | ORAL_TABLET | Freq: Two times a day (BID) | ORAL | 0 refills | Status: DC

## 2022-10-26 NOTE — Telephone Encounter (Signed)
Patient lvm requesting a refill on the Methylphenidate. Fill at the [id number] [place], [place] - [address] AT [facility] [place], [place] [address] Phone: [phone]  Fax: [phone]   Appointment 12/12/22 Contact # [phone]

## 2022-10-26 NOTE — Telephone Encounter (Signed)
Please let her know it has been sent.  

## 2022-12-12 ENCOUNTER — Encounter: Payer: Self-pay | Admitting: Psychiatry

## 2022-12-12 ENCOUNTER — Telehealth (INDEPENDENT_AMBULATORY_CARE_PROVIDER_SITE_OTHER): Payer: 59 | Admitting: Psychiatry

## 2022-12-12 DIAGNOSIS — F4010 Social phobia, unspecified: Secondary | ICD-10-CM

## 2022-12-12 DIAGNOSIS — F988 Other specified behavioral and emotional disorders with onset usually occurring in childhood and adolescence: Secondary | ICD-10-CM

## 2022-12-12 DIAGNOSIS — F3181 Bipolar II disorder: Secondary | ICD-10-CM

## 2022-12-12 MED ORDER — LAMOTRIGINE 150 MG PO TABS
150.0000 mg | ORAL_TABLET | Freq: Every day | ORAL | 1 refills | Status: DC
Start: 2022-12-12 — End: 2023-04-05

## 2022-12-12 MED ORDER — METHYLPHENIDATE HCL 20 MG PO TABS
20.0000 mg | ORAL_TABLET | Freq: Two times a day (BID) | ORAL | 0 refills | Status: DC
Start: 2023-01-20 — End: 2023-03-27

## 2022-12-12 MED ORDER — BUSPIRONE HCL 15 MG PO TABS: 15.0000 mg | ORAL_TABLET | Freq: Two times a day (BID) | ORAL | 1 refills | Status: DC

## 2022-12-12 MED ORDER — METHYLPHENIDATE HCL 20 MG PO TABS
20.0000 mg | ORAL_TABLET | Freq: Two times a day (BID) | ORAL | 0 refills | Status: DC
Start: 2023-02-17 — End: 2023-03-27

## 2022-12-12 MED ORDER — METHYLPHENIDATE HCL 20 MG PO TABS: 20.0000 mg | ORAL_TABLET | Freq: Two times a day (BID) | ORAL | 0 refills | Status: DC

## 2022-12-12 NOTE — Progress Notes (Signed)
Jessica Suarez [id number] April 15, 1981 42 y.o.  Virtual Visit via Video Note  I connected with pt @ on 12/12/22 at  8:30 AM EDT by a video enabled telemedicine application and verified that I am speaking with the correct person using two identifiers.   I discussed the limitations of evaluation and management by telemedicine and the availability of in person appointments. The patient expressed understanding and agreed to proceed.  I discussed the assessment and treatment plan with the patient. The patient was provided an opportunity to ask questions and all were answered. The patient agreed with the plan and demonstrated an understanding of the instructions.   The patient was advised to call back or seek an in-person evaluation if the symptoms worsen or if the condition fails to improve as anticipated.  I provided 37 minutes of non-face-to-face time during this encounter.  The patient was located at home.  The provider was located at [facility].   Corie Chiquito, PMHNP   Subjective:   Patient ID:  Jessica Suarez is a 42 y.o. (DOB 03-28-1981) female.  Chief Complaint:  Chief Complaint  Patient presents with   Anxiety    Anxiety     Jessica Suarez presents for follow-up of ADHD, Bipolar Disorder, and anxiety.   She reports that she has been needing to take Seroquel 100 mg "almost every day for almost 2 weeks." She reports that it helps with her anxiety and also causes her to feel "blah." She reports that she has had some work related stress. Her nephew graduated in June in [place] and she had panic about driving to his graduation and was unable to attend. She reports that she has not been wanting to leave home other than to go to work due to anxiety and feeling more tired. She takes Propranolol prn periodically, such as before a major conference call. She reports that she has some anxiety with highway driving and reports that in the past she would have "tunnel vision" and  not be able to see peripherally.  She reports that she has experienced depression at times. She has had some irritability. Denies any recent manic symptoms. She reports that she was not eating much for a period of time. A few times she felt "sick" and realized it was from not eating. Appetite has been improving and she has been more intentional about eating. She reports that she has been getting less sleep with schedule change. She reports that she occasionally will hyper-fixate on getting things cleaned and straightened up. She reports, "I spend all my energy at work." She reports that she is sometimes working 12 hours at a time and "hit a wall." Motivation has been good. She reports that her concentration has been "up and down." She reports that one of the conference rooms at work is extremely cold and this interferes with her focus. Denies SI.   She has been going into work at 12 midnight and "that's been an adjustment." She reports that she is now sleeping when husband is awake and vice versa with this work schedule.  She reports that her new supervisor occasionally will have some behaviors similar to previous supervisor that cause her some anger. She was able to have a productive conversation with her supervisor about her concerns. 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   She has a new puppy that has lots of energy.  Ritalin last filled 11/25/22.  Past Medication Trials: Latuda- "did not like how it made me feel" Seroquel Wellbutrin-ineffective Zoloft Effexor Prozac-helpful for PMDD signs and symptoms Lexapro Provigil-not as helpful for daytime somnolence Methylphenidate BuSpar-helpful for anxiety Lamictal-helpful for mood Propranolol Guanfacine-ineffective  Review of Systems:  Review of Systems  Musculoskeletal:  Negative for gait problem.       Shoulder pain   Neurological:  Negative for tremors.  Psychiatric/Behavioral:         Please refer to HPI    Medications: I have reviewed the patient's current medications.  Current Outpatient Medications  Medication Sig Dispense Refill   acetaminophen (TYLENOL) 325 MG tablet Take 650 mg by mouth every 6 (six) hours as needed.     levocetirizine (XYZAL) 5 MG tablet Take 5 mg by mouth every evening.     lidocaine (LIDODERM) 5 % Place 1 patch onto the skin daily. Remove & Discard patch within 12 hours or as directed by MD     propranolol (INDERAL) 10 MG tablet TAKE 1 TO 2 TABLETS BY MOUTH TWICE A DAY AS NEEDED FOR ANXIETY 360 tablet 1   QUEtiapine (SEROQUEL) 50 MG tablet TAKE 1-2 TABLETS BY MOUTH AT BEDTIME 180 tablet 1   busPIRone (BUSPAR) 15 MG tablet Take 1 tablet (15 mg total) by mouth 2 (two) times daily. 180 tablet 1   cholecalciferol (VITAMIN D3) 25 MCG (1000 UNIT) tablet Take 1,000 Units by mouth daily.     EPINEPHrine (EPIPEN 2-PAK) 0.3 mg/0.3 mL IJ SOAJ injection Inject into the muscle as needed (INJECTION PROTOCOL).     lamoTRIgine (LAMICTAL) 150 MG tablet Take 1 tablet (150 mg total) by mouth daily. 90 tablet 1   [START ON 12/23/2022] methylphenidate (RITALIN) 20 MG tablet Take 1 tablet (20 mg total) by mouth 2 (two) times daily with breakfast and lunch. May take an additional 1/2 tab as needed 75 tablet 0   [START ON 01/20/2023] methylphenidate (RITALIN) 20 MG tablet Take 1 tablet (20 mg total) by mouth 2 (two) times daily with breakfast and lunch. May take an additional 1/2 tab as needed 75 tablet 0   [START ON 02/17/2023] methylphenidate (RITALIN) 20 MG tablet Take 1 tablet (20 mg total) by mouth 2 (two) times daily. May take an additional 1/2 tab as needed 75 tablet 0   No current facility-administered medications for this visit.    Medication Side Effects: Other: Some dampening with higher dose of Seroquel.   Allergies: No Known Allergies  Past Medical History:  Diagnosis Date    Allergic rhinitis    Depression    History of Hodgkin's lymphoma MAY 2004   IBS (irritable bowel syndrome)    Migraines    Sleep disorder     Family History  Problem Relation Age of Onset   Hypertension Mother    Hypercholesterolemia Mother    Asthma Father    Diabetes Father    Asthma Brother    Bipolar disorder Brother    Suicidality Brother    Drug abuse Paternal Aunt    Aneurysm Paternal Aunt    Anxiety disorder Other    Cancer Paternal Aunt     Social History   Socioeconomic History   Marital status: Single    Spouse name: Not on file   Number of children: Not on file   Years of education: Not on file   Highest education level: Not on file  Occupational History   Not on file  Tobacco Use   Smoking  status: Passive Smoke Exposure - Never Smoker   Smokeless tobacco: Never  Substance and Sexual Activity   Alcohol use: Yes    Alcohol/week: 2.0 standard drinks of alcohol    Types: 2 Shots of liquor per week   Drug use: No   Sexual activity: Yes    Birth control/protection: None  Other Topics Concern   Not on file  Social History Narrative   Not on file   Social Determinants of Health   Financial Resource Strain: Not on file  Food Insecurity: Not on file  Transportation Needs: Not on file  Physical Activity: Not on file  Stress: Not on file  Social Connections: Not on file  Intimate Partner Violence: Not on file    Past Medical History, Surgical history, Social history, and Family history were reviewed and updated as appropriate.   Please see review of systems for further details on the patient's review from today.   Objective:   Physical Exam:  Pulse 95   Physical Exam Neurological:     Mental Status: She is alert and oriented to person, place, and time.     Cranial Nerves: No dysarthria.  Psychiatric:        Attention and Perception: Attention and perception normal.        Mood and Affect: Mood is anxious.        Speech: Speech normal.         Behavior: Behavior is cooperative.        Thought Content: Thought content normal. Thought content is not paranoid or delusional. Thought content does not include homicidal or suicidal ideation. Thought content does not include homicidal or suicidal plan.        Cognition and Memory: Cognition and memory normal.        Judgment: Judgment normal.     Comments: Insight intact Dysphoric mood     Lab Review:  No results found for: "NA", "K", "CL", "CO2", "GLUCOSE", "BUN", "CREATININE", "CALCIUM", "PROT", "ALBUMIN", "AST", "ALT", "ALKPHOS", "BILITOT", "GFRNONAA", "GFRAA"  No results found for: "WBC", "RBC", "HGB", "HCT", "PLT", "MCV", "MCH", "MCHC", "RDW", "LYMPHSABS", "MONOABS", "EOSABS", "BASOSABS"  No results found for: "POCLITH", "LITHIUM"   No results found for: "PHENYTOIN", "PHENOBARB", "VALPROATE", "CBMZ"   .res Assessment: Plan:   40 minutes spent dedicated to the care of this patient on the date of this encounter to include pre-visit review of records, ordering of medication, post visit documentation, and face-to-face time with the patient discussing treatment plan and risks vs benefits of Seroquel. Pt reports that she would like to continue current medications without changes at this time. She reports that she has been adjusting dose of Seroquel between 50-100 mg based on level of anxiety since she reports that 100 mg dose is more effective for her anxiety, but causes some affective dulling/dampening. Offered to discuss alternatives to Seroquel and she reports that she would like to continue Seroquel since benefits outweigh side effects.  Will continue Ritalin 20 mg twice daily and an additional 1/2 tablet as needed for ADHD.  Continue Buspar 15 mg twice daily for anxiety.  Continue Lamictal 150 mg daily for mood symptoms.  Continue Seroquel 50-100 mg at bedtime for insomnia, anxiety, and mood symptoms.  Continue Propranolol prn anxiety.  Pt to follow-up in 3 months or sooner if  clinically indicated.  Patient advised to contact office with any questions, adverse effects, or acute worsening in signs and symptoms.   Koral was seen today for anxiety.  Diagnoses and all  orders for this visit:  Attention deficit disorder (ADD) without hyperactivity -     methylphenidate (RITALIN) 20 MG tablet; Take 1 tablet (20 mg total) by mouth 2 (two) times daily with breakfast and lunch. May take an additional 1/2 tab as needed -     methylphenidate (RITALIN) 20 MG tablet; Take 1 tablet (20 mg total) by mouth 2 (two) times daily with breakfast and lunch. May take an additional 1/2 tab as needed -     methylphenidate (RITALIN) 20 MG tablet; Take 1 tablet (20 mg total) by mouth 2 (two) times daily. May take an additional 1/2 tab as needed  Social anxiety disorder -     busPIRone (BUSPAR) 15 MG tablet; Take 1 tablet (15 mg total) by mouth 2 (two) times daily.  Bipolar II disorder (HCC) -     lamoTRIgine (LAMICTAL) 150 MG tablet; Take 1 tablet (150 mg total) by mouth daily.     Please see After Visit Summary for patient specific instructions.  No future appointments.  No orders of the defined types were placed in this encounter.     -------------------------------

## 2023-03-15 ENCOUNTER — Encounter: Payer: Self-pay | Admitting: Psychiatry

## 2023-03-27 ENCOUNTER — Telehealth: Payer: Self-pay | Admitting: Psychiatry

## 2023-03-27 ENCOUNTER — Other Ambulatory Visit: Payer: Self-pay

## 2023-03-27 DIAGNOSIS — F988 Other specified behavioral and emotional disorders with onset usually occurring in childhood and adolescence: Secondary | ICD-10-CM

## 2023-03-27 MED ORDER — METHYLPHENIDATE HCL 20 MG PO TABS
20.0000 mg | ORAL_TABLET | Freq: Two times a day (BID) | ORAL | 0 refills | Status: DC
Start: 2023-03-27 — End: 2023-04-05

## 2023-03-27 MED ORDER — METHYLPHENIDATE HCL 20 MG PO TABS
20.0000 mg | ORAL_TABLET | Freq: Two times a day (BID) | ORAL | 0 refills | Status: AC
Start: 2023-04-24 — End: ?

## 2023-03-27 MED ORDER — METHYLPHENIDATE HCL 20 MG PO TABS
20.0000 mg | ORAL_TABLET | Freq: Two times a day (BID) | ORAL | 0 refills | Status: DC
Start: 2023-05-22 — End: 2023-05-29

## 2023-03-27 NOTE — Telephone Encounter (Signed)
Patient takes 20 mg BID, not 10 mg Ritalin.

## 2023-03-27 NOTE — Telephone Encounter (Signed)
Pt has an appt 12/4. She needs a refill on her ritalin 10 mg. Pharmacy is [facility] on [address]

## 2023-03-27 NOTE — Telephone Encounter (Signed)
Pended 3 RF for Ritalin 20 mg BID to [facility] on [address]

## 2023-04-05 ENCOUNTER — Ambulatory Visit: Payer: 59 | Admitting: Psychiatry

## 2023-04-05 ENCOUNTER — Encounter: Payer: Self-pay | Admitting: Psychiatry

## 2023-04-05 DIAGNOSIS — F3181 Bipolar II disorder: Secondary | ICD-10-CM | POA: Diagnosis not present

## 2023-04-05 DIAGNOSIS — F4010 Social phobia, unspecified: Secondary | ICD-10-CM

## 2023-04-05 DIAGNOSIS — F988 Other specified behavioral and emotional disorders with onset usually occurring in childhood and adolescence: Secondary | ICD-10-CM

## 2023-04-05 MED ORDER — METHYLPHENIDATE HCL 20 MG PO TABS
20.0000 mg | ORAL_TABLET | Freq: Two times a day (BID) | ORAL | 0 refills | Status: AC
Start: 2023-06-19 — End: ?

## 2023-04-05 MED ORDER — QUETIAPINE FUMARATE 50 MG PO TABS
ORAL_TABLET | ORAL | 1 refills | Status: AC
Start: 2023-04-05 — End: ?

## 2023-04-05 MED ORDER — LAMOTRIGINE 150 MG PO TABS
150.0000 mg | ORAL_TABLET | Freq: Every day | ORAL | 1 refills | Status: AC
Start: 2023-04-05 — End: 2023-07-04

## 2023-04-05 MED ORDER — BUSPIRONE HCL 15 MG PO TABS
15.0000 mg | ORAL_TABLET | Freq: Two times a day (BID) | ORAL | 1 refills | Status: AC
Start: 2023-04-05 — End: 2023-10-02

## 2023-04-05 NOTE — Progress Notes (Signed)
Jessica Suarez [id number] May 15, 1980 42 y.o.  Subjective:   Patient ID:  Jessica Suarez is a 42 y.o. (DOB 02-Oct-1980) female.  Chief Complaint:  Chief Complaint  Patient presents with   Anxiety    Anxiety Patient reports no palpitations.     Jessica Suarez presents to the office today for follow-up of Bipolar disorder, anxiety, and insomnia. She reports that she may need to have Lamictal and Buspar mailed to her home. She was out of Lamictal during the week  of Thanksgiving and noticed "more anxiety that I would on a work week." She notices increased anxiety when she runs out of Buspar as well. She reports some increased anxiety and is not sure if this is related to work stress. She reports that she has had the desire to clean, de-clutter, and paint her house, however she had difficulty pushing herself to get started with this. She notices lower mood every year around this time of year with colder weather, decreased daylight, and the holidays. She reports that it is difficult to get out of bed some days.She reports that she has had some "vivid bad dreams" which she reports that has happened in the past with increased stress. Denies panic attacks. She notices some avoidance and procrastination related to anxiety.  Enjoying her new puppy and her dog, Buddy. Reports some frustration in response to some of husband's behaviors. She recently "went stress shopping." Denies impulsive spending. Energy and motivation are "the same." She reports she is trying to get adequate sleep with working nights. She has some broken sleep with her dogs needing to go out. She reports that she is not as productive at home as she has been in the past. She reports that sometimes she has difficulty with focus because she does not want to take medication too late and have sleep interference. Denies SI.   Re-started Lamictal about a week ago.  Ritalin last filled 03/27/23.  Getting house ready    Past Medication  Trials: Latuda- "did not like how it made me feel" Seroquel Wellbutrin-ineffective Zoloft Effexor Prozac-helpful for PMDD signs and symptoms Lexapro Provigil-not as helpful for daytime somnolence Methylphenidate BuSpar-helpful for anxiety Lamictal-helpful for mood Propranolol Guanfacine-ineffective  AIMS    Flowsheet Row Video Visit from 12/12/2022 in [facility] Office Visit from 09/12/2022 in [facility] Office Visit from 06/13/2022 in [facility] Video Visit from 03/30/2022 in [facility] Video Visit from 12/13/2021 in [facility]  AIMS Total Score 0 0 0 0 0        Review of Systems:  Review of Systems  Cardiovascular:  Negative for palpitations.  Musculoskeletal:  Negative for gait problem.  Skin:  Negative for rash.  Neurological:  Negative for tremors.  Psychiatric/Behavioral:         Please refer to HPI    Medications: I have reviewed the patient's current medications.  Current Outpatient Medications  Medication Sig Dispense Refill   acetaminophen (TYLENOL) 325 MG tablet Take 650 mg by mouth every 6 (six) hours as needed.     busPIRone (BUSPAR) 15 MG tablet Take 1 tablet (15 mg total) by mouth 2 (two) times daily. 180 tablet 1   cholecalciferol (VITAMIN D3) 25 MCG (1000 UNIT) tablet Take 1,000 Units by mouth daily.     EPINEPHrine (EPIPEN 2-PAK) 0.3 mg/0.3 mL IJ SOAJ injection Inject into the muscle as needed (INJECTION PROTOCOL).  lamoTRIgine (LAMICTAL) 150 MG tablet Take 1 tablet (150 mg total) by mouth daily. 90 tablet 1   levocetirizine (XYZAL) 5 MG tablet Take 5 mg by mouth every evening.     lidocaine (LIDODERM) 5 % Place 1 patch onto the skin daily. Remove & Discard patch within 12 hours or as directed by MD     [START ON 05/22/2023] methylphenidate (RITALIN) 20 MG tablet Take 1 tablet (20 mg total) by mouth 2 (two)  times daily. May take an additional 1/2 tab as needed 75 tablet 0   [START ON 04/24/2023] methylphenidate (RITALIN) 20 MG tablet Take 1 tablet (20 mg total) by mouth 2 (two) times daily with breakfast and lunch. May take an additional 1/2 tab as needed 75 tablet 0   [START ON 06/19/2023] methylphenidate (RITALIN) 20 MG tablet Take 1 tablet (20 mg total) by mouth 2 (two) times daily with breakfast and lunch. May take an additional 1/2 tab as needed 75 tablet 0   propranolol (INDERAL) 10 MG tablet TAKE 1 TO 2 TABLETS BY MOUTH TWICE A DAY AS NEEDED FOR ANXIETY 360 tablet 1   QUEtiapine (SEROQUEL) 50 MG tablet TAKE 1-2 TABLETS BY MOUTH AT BEDTIME 180 tablet 1   No current facility-administered medications for this visit.    Medication Side Effects: None  Allergies: No Known Allergies  Past Medical History:  Diagnosis Date   Allergic rhinitis    Depression    History of Hodgkin's lymphoma MAY 2004   IBS (irritable bowel syndrome)    Migraines    Sleep disorder     Past Medical History, Surgical history, Social history, and Family history were reviewed and updated as appropriate.   Please see review of systems for further details on the patient's review from today.   Objective:   Physical Exam:  BP 131/89   Pulse 86   Physical Exam Constitutional:      General: She is not in acute distress. Musculoskeletal:        General: No deformity.  Neurological:     Mental Status: She is alert and oriented to person, place, and time.     Coordination: Coordination normal.  Psychiatric:        Attention and Perception: Attention and perception normal. She does not perceive auditory or visual hallucinations.        Mood and Affect: Mood is anxious. Mood is not depressed. Affect is not labile, blunt, angry or inappropriate.        Speech: Speech normal.        Behavior: Behavior normal.        Thought Content: Thought content normal. Thought content is not paranoid or delusional. Thought  content does not include homicidal or suicidal ideation. Thought content does not include homicidal or suicidal plan.        Cognition and Memory: Cognition and memory normal.        Judgment: Judgment normal.     Comments: Insight intact     Lab Review:  No results found for: "NA", "K", "CL", "CO2", "GLUCOSE", "BUN", "CREATININE", "CALCIUM", "PROT", "ALBUMIN", "AST", "ALT", "ALKPHOS", "BILITOT", "GFRNONAA", "GFRAA"  No results found for: "WBC", "RBC", "HGB", "HCT", "PLT", "MCV", "MCH", "MCHC", "RDW", "LYMPHSABS", "MONOABS", "EOSABS", "BASOSABS"  No results found for: "POCLITH", "LITHIUM"   No results found for: "PHENYTOIN", "PHENOBARB", "VALPROATE", "CBMZ"   .res Assessment: Plan:    35 minutes spent dedicated to the care of this patient on the date of this encounter to include pre-visit review  of records, ordering of medication, post visit documentation, and face-to-face time with the patient discussing trying to avoid lapses in Lamotrigine and contacting provider if she has missed more than 5 days so that Lamotrigine can be titrated to minimize risk of Levonne Spiller syndrome. Also discussed transition of care since provider will be leaving the practice. Pt reports that she prefers to continue care with this provider.  Agree with plan to switch Buspar and Lamotrigine to mail order to minimize lapses in treatment. Will continue Buspar 15 mg po BID for anxiety.  Continue lamictal 150 mg daily for mood stabilization.  Continue Ritalin 20 mg morning and lunch and an additional 1/2 tab as needed.  Continue Propranolol prn anxiety.  Continue Seroquel 50 mg 1-2 tablets at bedtime for insomnia and mood stabilization.  Pt to follow-up in 2-3 months or sooner if clinically indicated.  Patient advised to contact office with any questions, adverse effects, or acute worsening in signs and symptoms.   Jessica Suarez was seen today for anxiety.  Diagnoses and all orders for this visit:  Attention  deficit disorder (ADD) without hyperactivity -     methylphenidate (RITALIN) 20 MG tablet; Take 1 tablet (20 mg total) by mouth 2 (two) times daily with breakfast and lunch. May take an additional 1/2 tab as needed  Social anxiety disorder -     busPIRone (BUSPAR) 15 MG tablet; Take 1 tablet (15 mg total) by mouth 2 (two) times daily.  Bipolar II disorder (HCC) -     lamoTRIgine (LAMICTAL) 150 MG tablet; Take 1 tablet (150 mg total) by mouth daily. -     QUEtiapine (SEROQUEL) 50 MG tablet; TAKE 1-2 TABLETS BY MOUTH AT BEDTIME     Please see After Visit Summary for patient specific instructions.  No future appointments.  No orders of the defined types were placed in this encounter.   -------------------------------

## 2023-05-29 ENCOUNTER — Telehealth: Payer: Self-pay | Admitting: Psychiatry

## 2023-05-29 ENCOUNTER — Other Ambulatory Visit: Payer: Self-pay

## 2023-05-29 DIAGNOSIS — F988 Other specified behavioral and emotional disorders with onset usually occurring in childhood and adolescence: Secondary | ICD-10-CM

## 2023-05-29 MED ORDER — METHYLPHENIDATE HCL 20 MG PO TABS: 20.0000 mg | ORAL_TABLET | Freq: Two times a day (BID) | ORAL | 0 refills | Status: AC

## 2023-05-29 NOTE — Telephone Encounter (Signed)
Canceled Rx at [facility], pended for [facility] on [address] for Ritalin.

## 2023-05-29 NOTE — Telephone Encounter (Signed)
Jessica Suarez called at 10:15 to request that her 05/22/23 prescription for Ritalin be transferred to [id number] - [place], [place] - [address] .  The other pharmacy doesn't have it.  She is checking on following Jessica to [facility].
